# Patient Record
Sex: Female | Born: 1980 | Race: Black or African American | Hispanic: No | Smoking: Former smoker
Health system: Southern US, Community
[De-identification: ages and names within clinical notes are randomized; demographics above are authoritative.]

## PROBLEM LIST (undated history)

## (undated) DIAGNOSIS — O009 Unspecified ectopic pregnancy without intrauterine pregnancy: Secondary | ICD-10-CM

## (undated) DIAGNOSIS — F419 Anxiety disorder, unspecified: Secondary | ICD-10-CM

## (undated) DIAGNOSIS — R000 Tachycardia, unspecified: Secondary | ICD-10-CM

## (undated) DIAGNOSIS — E049 Nontoxic goiter, unspecified: Secondary | ICD-10-CM

## (undated) DIAGNOSIS — R079 Chest pain, unspecified: Secondary | ICD-10-CM

## (undated) DIAGNOSIS — L309 Dermatitis, unspecified: Secondary | ICD-10-CM

## (undated) DIAGNOSIS — K589 Irritable bowel syndrome without diarrhea: Secondary | ICD-10-CM

## (undated) DIAGNOSIS — K219 Gastro-esophageal reflux disease without esophagitis: Secondary | ICD-10-CM

## (undated) HISTORY — PX: EXTERNAL EAR SURGERY: SHX627

## (undated) HISTORY — PX: TONSILLECTOMY: SUR1361

---
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
## 1999-01-27 ENCOUNTER — Emergency Department (HOSPITAL_COMMUNITY): Admission: EM | Admit: 1999-01-27 | Discharge: 1999-01-28 | Payer: Self-pay | Admitting: *Deleted

## 1999-01-27 ENCOUNTER — Emergency Department (HOSPITAL_COMMUNITY): Admission: EM | Admit: 1999-01-27 | Discharge: 1999-01-27 | Payer: Self-pay | Admitting: *Deleted

## 1999-04-18 ENCOUNTER — Emergency Department (HOSPITAL_COMMUNITY): Admission: EM | Admit: 1999-04-18 | Discharge: 1999-04-18 | Payer: Self-pay | Admitting: Emergency Medicine

## 1999-06-26 ENCOUNTER — Emergency Department (HOSPITAL_COMMUNITY): Admission: EM | Admit: 1999-06-26 | Discharge: 1999-06-26 | Payer: Self-pay | Admitting: Emergency Medicine

## 1999-09-27 ENCOUNTER — Emergency Department (HOSPITAL_COMMUNITY): Admission: EM | Admit: 1999-09-27 | Discharge: 1999-09-27 | Payer: Self-pay

## 2000-05-03 ENCOUNTER — Emergency Department (HOSPITAL_COMMUNITY): Admission: EM | Admit: 2000-05-03 | Discharge: 2000-05-03 | Payer: Self-pay | Admitting: Emergency Medicine

## 2001-08-01 ENCOUNTER — Emergency Department (HOSPITAL_COMMUNITY): Admission: EM | Admit: 2001-08-01 | Discharge: 2001-08-01 | Payer: Self-pay | Admitting: Emergency Medicine

## 2002-07-08 ENCOUNTER — Emergency Department (HOSPITAL_COMMUNITY): Admission: EM | Admit: 2002-07-08 | Discharge: 2002-07-08 | Payer: Self-pay | Admitting: Emergency Medicine

## 2003-06-08 ENCOUNTER — Emergency Department (HOSPITAL_COMMUNITY): Admission: EM | Admit: 2003-06-08 | Discharge: 2003-06-08 | Payer: Self-pay | Admitting: Emergency Medicine

## 2003-12-28 ENCOUNTER — Emergency Department (HOSPITAL_COMMUNITY): Admission: EM | Admit: 2003-12-28 | Discharge: 2003-12-28 | Payer: Self-pay | Admitting: Emergency Medicine

## 2004-03-15 ENCOUNTER — Emergency Department (HOSPITAL_COMMUNITY): Admission: EM | Admit: 2004-03-15 | Discharge: 2004-03-15 | Payer: Self-pay | Admitting: Emergency Medicine

## 2004-07-30 ENCOUNTER — Emergency Department (HOSPITAL_COMMUNITY): Admission: EM | Admit: 2004-07-30 | Discharge: 2004-07-30 | Payer: Self-pay | Admitting: Emergency Medicine

## 2004-08-06 ENCOUNTER — Emergency Department (HOSPITAL_COMMUNITY): Admission: EM | Admit: 2004-08-06 | Discharge: 2004-08-06 | Payer: Self-pay | Admitting: Emergency Medicine

## 2004-08-19 ENCOUNTER — Emergency Department (HOSPITAL_COMMUNITY): Admission: EM | Admit: 2004-08-19 | Discharge: 2004-08-19 | Payer: Self-pay | Admitting: Family Medicine

## 2005-04-30 ENCOUNTER — Emergency Department (HOSPITAL_COMMUNITY): Admission: EM | Admit: 2005-04-30 | Discharge: 2005-04-30 | Payer: Self-pay | Admitting: Emergency Medicine

## 2005-04-30 ENCOUNTER — Emergency Department (HOSPITAL_COMMUNITY): Admission: EM | Admit: 2005-04-30 | Discharge: 2005-04-30 | Payer: Self-pay | Admitting: Family Medicine

## 2006-03-31 ENCOUNTER — Emergency Department (HOSPITAL_COMMUNITY): Admission: EM | Admit: 2006-03-31 | Discharge: 2006-03-31 | Payer: Self-pay | Admitting: Emergency Medicine

## 2006-06-05 ENCOUNTER — Emergency Department (HOSPITAL_COMMUNITY): Admission: EM | Admit: 2006-06-05 | Discharge: 2006-06-05 | Payer: Self-pay | Admitting: Emergency Medicine

## 2006-08-05 ENCOUNTER — Emergency Department (HOSPITAL_COMMUNITY): Admission: EM | Admit: 2006-08-05 | Discharge: 2006-08-05 | Payer: Self-pay | Admitting: Emergency Medicine

## 2006-11-18 ENCOUNTER — Emergency Department (HOSPITAL_COMMUNITY): Admission: EM | Admit: 2006-11-18 | Discharge: 2006-11-18 | Payer: Self-pay | Admitting: Family Medicine

## 2006-11-22 ENCOUNTER — Emergency Department (HOSPITAL_COMMUNITY): Admission: EM | Admit: 2006-11-22 | Discharge: 2006-11-22 | Payer: Self-pay | Admitting: Family Medicine

## 2007-01-01 ENCOUNTER — Emergency Department (HOSPITAL_COMMUNITY): Admission: EM | Admit: 2007-01-01 | Discharge: 2007-01-01 | Payer: Self-pay | Admitting: Family Medicine

## 2007-07-15 ENCOUNTER — Emergency Department (HOSPITAL_COMMUNITY): Admission: EM | Admit: 2007-07-15 | Discharge: 2007-07-15 | Payer: Self-pay | Admitting: Family Medicine

## 2007-08-09 ENCOUNTER — Emergency Department (HOSPITAL_COMMUNITY): Admission: EM | Admit: 2007-08-09 | Discharge: 2007-08-09 | Payer: Self-pay | Admitting: Emergency Medicine

## 2007-10-02 ENCOUNTER — Emergency Department (HOSPITAL_COMMUNITY): Admission: EM | Admit: 2007-10-02 | Discharge: 2007-10-02 | Payer: Self-pay | Admitting: Emergency Medicine

## 2008-01-05 ENCOUNTER — Emergency Department (HOSPITAL_COMMUNITY): Admission: EM | Admit: 2008-01-05 | Discharge: 2008-01-06 | Payer: Self-pay | Admitting: Emergency Medicine

## 2008-01-17 ENCOUNTER — Emergency Department (HOSPITAL_COMMUNITY): Admission: EM | Admit: 2008-01-17 | Discharge: 2008-01-17 | Payer: Self-pay | Admitting: Family Medicine

## 2008-03-21 ENCOUNTER — Emergency Department (HOSPITAL_COMMUNITY): Admission: EM | Admit: 2008-03-21 | Discharge: 2008-03-21 | Payer: Self-pay | Admitting: Family Medicine

## 2008-09-22 ENCOUNTER — Emergency Department (HOSPITAL_COMMUNITY): Admission: EM | Admit: 2008-09-22 | Discharge: 2008-09-22 | Payer: Self-pay | Admitting: Emergency Medicine

## 2008-11-15 ENCOUNTER — Emergency Department (HOSPITAL_COMMUNITY): Admission: EM | Admit: 2008-11-15 | Discharge: 2008-11-15 | Payer: Self-pay | Admitting: Emergency Medicine

## 2009-03-12 ENCOUNTER — Emergency Department (HOSPITAL_COMMUNITY): Admission: EM | Admit: 2009-03-12 | Discharge: 2009-03-12 | Payer: Self-pay | Admitting: Emergency Medicine

## 2009-05-10 ENCOUNTER — Ambulatory Visit: Payer: Self-pay | Admitting: Cardiovascular Disease

## 2009-05-10 ENCOUNTER — Ambulatory Visit: Payer: Self-pay | Admitting: Internal Medicine

## 2009-05-10 ENCOUNTER — Observation Stay (HOSPITAL_COMMUNITY): Admission: EM | Admit: 2009-05-10 | Discharge: 2009-05-11 | Payer: Self-pay | Admitting: Emergency Medicine

## 2009-05-11 ENCOUNTER — Encounter: Payer: Self-pay | Admitting: Internal Medicine

## 2009-05-28 ENCOUNTER — Ambulatory Visit (HOSPITAL_COMMUNITY): Admission: RE | Admit: 2009-05-28 | Discharge: 2009-05-28 | Payer: Self-pay | Admitting: Internal Medicine

## 2009-05-28 ENCOUNTER — Ambulatory Visit: Payer: Self-pay

## 2009-05-28 ENCOUNTER — Ambulatory Visit: Payer: Self-pay | Admitting: Cardiovascular Disease

## 2009-05-28 ENCOUNTER — Encounter: Payer: Self-pay | Admitting: Internal Medicine

## 2009-06-05 ENCOUNTER — Encounter: Payer: Self-pay | Admitting: Internal Medicine

## 2009-06-06 DIAGNOSIS — R079 Chest pain, unspecified: Secondary | ICD-10-CM | POA: Insufficient documentation

## 2009-06-07 ENCOUNTER — Ambulatory Visit: Payer: Self-pay | Admitting: Internal Medicine

## 2009-06-07 DIAGNOSIS — E049 Nontoxic goiter, unspecified: Secondary | ICD-10-CM | POA: Insufficient documentation

## 2009-06-12 ENCOUNTER — Ambulatory Visit (HOSPITAL_COMMUNITY): Admission: RE | Admit: 2009-06-12 | Discharge: 2009-06-12 | Payer: Self-pay | Admitting: Internal Medicine

## 2009-10-16 ENCOUNTER — Emergency Department (HOSPITAL_COMMUNITY): Admission: EM | Admit: 2009-10-16 | Discharge: 2009-10-16 | Payer: Self-pay | Admitting: Emergency Medicine

## 2010-02-07 ENCOUNTER — Emergency Department (HOSPITAL_COMMUNITY): Admission: EM | Admit: 2010-02-07 | Discharge: 2010-02-07 | Payer: Self-pay | Admitting: Emergency Medicine

## 2010-05-06 ENCOUNTER — Emergency Department (HOSPITAL_COMMUNITY)
Admission: EM | Admit: 2010-05-06 | Discharge: 2010-05-06 | Payer: Self-pay | Source: Home / Self Care | Admitting: Emergency Medicine

## 2010-06-02 LAB — CONVERTED CEMR LAB
T3, Free: 2.7 pg/mL (ref 2.3–4.2)
TSH: 0.4 microintl units/mL (ref 0.35–5.50)

## 2010-06-06 NOTE — Assessment & Plan Note (Signed)
Summary: eph/ gd   Primary Provider:  NONE  CC:  short lived CP and some SOB.  History of Present Illness: 30 y/o woman with no significant PMHx. Admitted with CP in January 2011 with anterior TWI. Had CT angio which was negative. CE normal. F/u stress echo was normal. Returns for f/u.  Overall doing fairly well . Feeling much better. Still with episodes of fleeting CP and SOB. Nothing prolonged. Able to do all her activities. No exertional symptoms. No HF symptoms. No palpitations.  Not exercising regularly.  Energy OK. Weight not going up. Continues to work as Associate Professor.   Current Medications (verified): 1)  Multivitamins   Tabs (Multiple Vitamin) .... Once Daily 2)  Ibuprofen 800 Mg Tabs (Ibuprofen) .... Every 8 Hrs As Needed  Allergies (verified): 1)  ! Amoxicillin 2)  ! Flagyl 3)  ! Pcn  Past History:  Past Medical History: 1. chest pain    --ECG with anterior TWI    --stress echo Jan 2011. Normal    --chest CT normal    Social History: Full Time Tobacco Use - Former. 2008 --- smoke 10 years Alcohol Use - yes - rarely Drug Use - no - marijuana as a teenager  Review of Systems       As per HPI and past medical history; otherwise all systems negative.   Vital Signs:  Patient profile:   30 year old female Height:      64 inches Weight:      184 pounds BMI:     31.70 Pulse rate:   69 / minute BP sitting:   104 / 70  (right arm) Cuff size:   regular  Vitals Entered By: Hardin Negus, RMA (June 07, 2009 3:54 PM)  Physical Exam  General:  Gen: well appearing. no resp difficulty HEENT: normal Neck: supple. no JVD. Carotids 2+ bilat; no bruits. Question  thryomegaly. Cor: PMI nondisplaced. Regular rate & rhythm. No rubs, gallops, murmur. Lungs: clear Abdomen: obese. soft, nontender, nondistended.  Extremities: no cyanosis, clubbing, rash, edema Neuro: alert & orientedx3, cranial nerves grossly intact. moves all 4 extremities w/o difficulty.  affect pleasant    Impression & Recommendations:  Problem # 1:  CHEST PAIN-UNSPECIFIED (ICD-786.50) Essentially resolved. Work-up very reassuring. No further cardiac testing needed at this time.  Problem # 2:  GOITER, UNSPECIFIED (ICD-240.9) Check thyroid panel and thyroid u/s.  Other Orders: EKG w/ Interpretation (93000) TLB-T4 (Thyrox), Free [phone]) TLB-TSH (Thyroid Stimulating Hormone) (84443-TSH) TLB-T3, Free (Triiodothyronine) (84481-T3FREE) Misc. Referral (Misc. Ref)  Patient Instructions: 1)  Labs today 2)  Thyroid Ultrasound

## 2010-06-06 NOTE — Letter (Signed)
Summary: Generic Letter  Architectural technologist, Main Office  [address]   [place], [place] [address]   Phone: [phone]  Fax: [phone]        June 05, 2009 MRN: [id number]    Zarriah Gater [address] [place], [place]  [address]    Dear Ms. Gladue,  We have been unable to reach you by phone regarding your test results.  Please give our office a call to receive your results.     Sincerely,  Meredith Staggers, RN Arvilla Meres, MD  This letter has been electronically signed by your physician.

## 2010-07-21 LAB — COMPREHENSIVE METABOLIC PANEL
ALT: 16 U/L (ref 0–35)
AST: 26 U/L (ref 0–37)
BUN: 10 mg/dL (ref 6–23)
CO2: 25 mEq/L (ref 19–32)
Chloride: 105 mEq/L (ref 96–112)
Creatinine, Ser: 0.83 mg/dL (ref 0.4–1.2)
GFR calc Af Amer: >60 mL/min (ref >60)
GFR calc non Af Amer: >60 mL/min (ref >60)
Glucose, Bld: 79 mg/dL (ref 70–99)
Sodium: 138 mEq/L (ref 135–145)
Total Bilirubin: 0.9 mg/dL (ref 0.3–1.2)

## 2010-07-21 LAB — POCT CARDIAC MARKERS
CKMB, poc: 1.1 ng/mL (ref 1.0–8.0)
Myoglobin, poc: 62.3 ng/mL (ref 12–200)
Myoglobin, poc: 65.8 ng/mL (ref 12–200)
Troponin i, poc: <0.05 ng/mL (ref 0.00–0.09)

## 2010-07-21 LAB — CBC
HCT: 34.7 % — ABNORMAL LOW (ref 36.0–46.0)
MCHC: 32.2 g/dL (ref 30.0–36.0)
MCHC: 32.8 g/dL (ref 30.0–36.0)
MCV: 73.3 fL — ABNORMAL LOW (ref 78.0–100.0)
Platelets: 207 10*3/uL (ref 150–400)
RBC: 4.3 MIL/uL (ref 3.87–5.11)
RBC: 4.73 MIL/uL (ref 3.87–5.11)
WBC: 6.6 10*3/uL (ref 4.0–10.5)

## 2010-07-21 LAB — LIPID PANEL
Cholesterol: 122 mg/dL (ref 0–200)
HDL: 35 mg/dL — ABNORMAL LOW (ref >39)
Total CHOL/HDL Ratio: 3.5 RATIO
Triglycerides: 36 mg/dL (ref <150)
VLDL: 7 mg/dL (ref 0–40)

## 2010-07-21 LAB — RAPID URINE DRUG SCREEN, HOSP PERFORMED
Barbiturates: NOT DETECTED
Benzodiazepines: NOT DETECTED
Cocaine: NOT DETECTED

## 2010-07-21 LAB — CARDIAC PANEL(CRET KIN+CKTOT+MB+TROPI)
CK, MB: 1.6 ng/mL (ref 0.3–4.0)
CK, MB: 2 ng/mL (ref 0.3–4.0)
Troponin I: 0.02 ng/mL (ref 0.00–0.06)
Troponin I: 0.02 ng/mL (ref 0.00–0.06)

## 2010-07-21 LAB — DIFFERENTIAL
Basophils Absolute: 0 10*3/uL (ref 0.0–0.1)
Eosinophils Absolute: 0.1 10*3/uL (ref 0.0–0.7)
Monocytes Relative: 4 % (ref 3–12)

## 2010-07-21 LAB — D-DIMER, QUANTITATIVE: D-Dimer, Quant: 0.54 ug/mL-FEU — ABNORMAL HIGH (ref 0.00–0.48)

## 2010-07-21 LAB — MAGNESIUM: Magnesium: 2 mg/dL (ref 1.5–2.5)

## 2010-07-21 LAB — HEPARIN LEVEL (UNFRACTIONATED): Heparin Unfractionated: 0.67 IU/mL (ref 0.30–0.70)

## 2010-10-31 ENCOUNTER — Emergency Department (HOSPITAL_COMMUNITY)
Admission: EM | Admit: 2010-10-31 | Discharge: 2010-10-31 | Disposition: A | Discharge status: Home / Self Care | Payer: Self-pay | Attending: Emergency Medicine | Admitting: Emergency Medicine

## 2010-10-31 DIAGNOSIS — K029 Dental caries, unspecified: Secondary | ICD-10-CM | POA: Insufficient documentation

## 2010-10-31 DIAGNOSIS — R070 Pain in throat: Secondary | ICD-10-CM | POA: Insufficient documentation

## 2010-10-31 DIAGNOSIS — H9209 Otalgia, unspecified ear: Secondary | ICD-10-CM | POA: Insufficient documentation

## 2011-02-04 ENCOUNTER — Emergency Department (HOSPITAL_COMMUNITY)
Admission: EM | Admit: 2011-02-04 | Discharge: 2011-02-04 | Disposition: A | Discharge status: Home / Self Care | Payer: BC Managed Care – PPO | Attending: Emergency Medicine | Admitting: Emergency Medicine

## 2011-02-04 ENCOUNTER — Emergency Department (HOSPITAL_COMMUNITY): Payer: BC Managed Care – PPO

## 2011-02-04 DIAGNOSIS — L2989 Other pruritus: Secondary | ICD-10-CM | POA: Insufficient documentation

## 2011-02-04 DIAGNOSIS — R21 Rash and other nonspecific skin eruption: Secondary | ICD-10-CM | POA: Insufficient documentation

## 2011-02-04 DIAGNOSIS — K219 Gastro-esophageal reflux disease without esophagitis: Secondary | ICD-10-CM | POA: Insufficient documentation

## 2011-02-04 DIAGNOSIS — L298 Other pruritus: Secondary | ICD-10-CM | POA: Insufficient documentation

## 2011-02-04 DIAGNOSIS — R1013 Epigastric pain: Secondary | ICD-10-CM | POA: Insufficient documentation

## 2011-02-04 DIAGNOSIS — R079 Chest pain, unspecified: Secondary | ICD-10-CM | POA: Insufficient documentation

## 2011-02-04 DIAGNOSIS — L259 Unspecified contact dermatitis, unspecified cause: Secondary | ICD-10-CM | POA: Insufficient documentation

## 2011-02-04 LAB — BASIC METABOLIC PANEL
BUN: 9 mg/dL (ref 6–23)
CO2: 27 mEq/L (ref 19–32)
Chloride: 102 mEq/L (ref 96–112)
Creatinine, Ser: 0.85 mg/dL (ref 0.50–1.10)

## 2011-02-04 LAB — CBC
HCT: 36.2 % (ref 36.0–46.0)
MCV: 71.8 fL — ABNORMAL LOW (ref 78.0–100.0)
RBC: 5.04 MIL/uL (ref 3.87–5.11)
WBC: 10.7 10*3/uL — ABNORMAL HIGH (ref 4.0–10.5)

## 2011-02-04 LAB — DIFFERENTIAL
Basophils Relative: 0 % (ref 0–1)
Eosinophils Relative: 1 % (ref 0–5)
Lymphs Abs: 2.6 10*3/uL (ref 0.7–4.0)
Monocytes Relative: 5 % (ref 3–12)
Neutro Abs: 7.5 10*3/uL (ref 1.7–7.7)

## 2011-02-04 LAB — POCT I-STAT TROPONIN I: Troponin i, poc: 0 ng/mL (ref 0.00–0.08)

## 2011-02-05 LAB — POCT I-STAT, CHEM 8
BUN: 13
Calcium, Ion: 1.22
Creatinine, Ser: 1.1
Glucose, Bld: 87
TCO2: 25

## 2011-02-05 LAB — POCT URINALYSIS DIP (DEVICE)
Bilirubin Urine: NEGATIVE
Ketones, ur: NEGATIVE
Operator id: 247071
Protein, ur: NEGATIVE

## 2011-02-05 LAB — POCT PREGNANCY, URINE: Preg Test, Ur: NEGATIVE

## 2011-02-17 LAB — POCT URINALYSIS DIP (DEVICE)
Bilirubin Urine: NEGATIVE
Glucose, UA: NEGATIVE
Hgb urine dipstick: NEGATIVE
Ketones, ur: NEGATIVE
Nitrite: NEGATIVE
Operator id: 235561
Specific Gravity, Urine: 1.025
Urobilinogen, UA: 0.2
pH: 7

## 2011-02-17 LAB — GC/CHLAMYDIA PROBE AMP, GENITAL: Chlamydia, DNA Probe: NEGATIVE

## 2011-02-17 LAB — POCT PREGNANCY, URINE: Preg Test, Ur: NEGATIVE

## 2011-02-17 LAB — WET PREP, GENITAL

## 2011-07-01 ENCOUNTER — Emergency Department (HOSPITAL_COMMUNITY)
Admission: EM | Admit: 2011-07-01 | Discharge: 2011-07-01 | Disposition: A | Discharge status: Home / Self Care | Payer: BC Managed Care – PPO | Attending: Emergency Medicine | Admitting: Emergency Medicine

## 2011-07-01 ENCOUNTER — Encounter (HOSPITAL_COMMUNITY): Payer: Self-pay

## 2011-07-01 DIAGNOSIS — H9209 Otalgia, unspecified ear: Secondary | ICD-10-CM | POA: Insufficient documentation

## 2011-07-01 DIAGNOSIS — H6120 Impacted cerumen, unspecified ear: Secondary | ICD-10-CM | POA: Insufficient documentation

## 2011-07-01 DIAGNOSIS — H9190 Unspecified hearing loss, unspecified ear: Secondary | ICD-10-CM | POA: Insufficient documentation

## 2011-07-01 DIAGNOSIS — Z79899 Other long term (current) drug therapy: Secondary | ICD-10-CM | POA: Insufficient documentation

## 2011-07-01 NOTE — ED Provider Notes (Signed)
History     CSN: [id number]  Arrival date & time 07/01/11  1206   First MD Initiated Contact with Patient 07/01/11 1236      HPI Patient reports this morning she was scratching her left ear and suddenly she could feel it "clogged up". States she tried some peroxide in the ear but had no relief. Denies any ear pain. Reports similar symptoms in the past but she was able to clear her ears on her own. Reports she does use Q-tips. Denies any purulent drainage, headache, fever, sore throat, lymphadenopathy. HPI  History reviewed. No pertinent past medical history.  Past Surgical History  Procedure Date  . External ear surgery     History reviewed. No pertinent family history.  History  Substance Use Topics  . Smoking status: Former Games developer  . Smokeless tobacco: Not on file  . Alcohol Use: Yes    OB History    Grav Para Term Preterm Abortions TAB SAB Ect Mult Living   0               Review of Systems  Constitutional: Negative for fever.  HENT: Positive for hearing loss. Negative for ear pain, congestion, rhinorrhea, neck pain and ear discharge.   Neurological: Negative for dizziness, light-headedness and headaches.    Allergies  Amoxicillin; Metronidazole; and Penicillins  Home Medications   Current Outpatient Rx  Name Route Sig Dispense Refill  . PANTOPRAZOLE SODIUM 40 MG PO TBEC Oral Take 40 mg by mouth daily as needed. For heart burn      BP 120/82  Pulse 75  Temp(Src) 98 F (36.7 C) (Oral)  Resp 20  SpO2 100%  LMP 06/27/2011  Physical Exam  Vitals reviewed. Constitutional: She is oriented to person, place, and time. Vital signs are normal. She appears well-developed and well-nourished. No distress.  HENT:  Head: Normocephalic and atraumatic.  Right Ear: Tympanic membrane, external ear and ear canal normal.  Left Ear: Tympanic membrane and external ear normal.  Ears:  Nose: Nose normal.  Mouth/Throat: Uvula is midline, oropharynx is clear and moist and  mucous membranes are normal.  Eyes: Pupils are equal, round, and reactive to light.  Neck: Neck supple.  Pulmonary/Chest: Effort normal.  Neurological: She is alert and oriented to person, place, and time.  Skin: Skin is warm and dry. No rash noted. No erythema. No pallor.  Psychiatric: She has a normal mood and affect. Her behavior is normal.    ED Course  Procedures  MDM  Irrigation completed by RN. Ear reassessed. clear with normal TMs. Patient reports she is ready for discharge. Discussed that she should not use Q-tips. Patient voices understanding .       Thomasene Lot, PA-C 07/01/11 1335

## 2011-07-01 NOTE — Discharge Instructions (Signed)
Cerumen Impaction A cerumen impaction is when the wax in your ear forms a plug. This plug usually causes reduced hearing. Sometimes it also causes an earache or dizziness. Removing a cerumen impaction can be difficult and painful. The wax sticks to the ear canal. The canal is sensitive and bleeds easily. If you try to remove a heavy wax buildup with a cotton tipped swab, you may push it in further. Irrigation with water, suction, and small ear curettes may be used to clear out the wax. If the impaction is fixed to the skin in the ear canal, ear drops may be needed for a few days to loosen the wax. People who build up a lot of wax frequently can use ear wax removal products available in your local drugstore. SEEK MEDICAL CARE IF:  You develop an earache, increased hearing loss, or marked dizziness. Document Released: 05/29/2004 Document Revised: 01/01/2011 Document Reviewed: 07/19/2009 Exit[employer] Patient Information 2012 [employer]. 

## 2011-07-01 NOTE — ED Notes (Signed)
Pt reports left ear clogged up, can barely hear, hx of same, peroxide did not work

## 2011-07-01 NOTE — ED Provider Notes (Signed)
Medical screening examination/treatment/procedure(s) were performed by non-physician practitioner and as supervising physician I was immediately available for consultation/collaboration. Devoria Albe, MD, Armando Gang   Ward Givens, MD 07/01/11 Windell Moment

## 2011-07-01 NOTE — ED Notes (Signed)
Left ear slowly irrigated with approx 140 mL warm water and peroxide (1:1 ratio).  Large amount of earwax removed.  Pt reports improvement in hearing and no complaints of pain.

## 2011-08-14 ENCOUNTER — Encounter (HOSPITAL_COMMUNITY): Payer: Self-pay | Admitting: *Deleted

## 2011-08-14 ENCOUNTER — Emergency Department (HOSPITAL_COMMUNITY)
Admission: EM | Admit: 2011-08-14 | Discharge: 2011-08-15 | Disposition: A | Discharge status: Home / Self Care | Payer: BC Managed Care – PPO | Attending: Emergency Medicine | Admitting: Emergency Medicine

## 2011-08-14 DIAGNOSIS — A499 Bacterial infection, unspecified: Secondary | ICD-10-CM | POA: Insufficient documentation

## 2011-08-14 DIAGNOSIS — N899 Noninflammatory disorder of vagina, unspecified: Secondary | ICD-10-CM | POA: Insufficient documentation

## 2011-08-14 DIAGNOSIS — N898 Other specified noninflammatory disorders of vagina: Secondary | ICD-10-CM

## 2011-08-14 DIAGNOSIS — B9689 Other specified bacterial agents as the cause of diseases classified elsewhere: Secondary | ICD-10-CM | POA: Insufficient documentation

## 2011-08-14 DIAGNOSIS — N760 Acute vaginitis: Secondary | ICD-10-CM | POA: Insufficient documentation

## 2011-08-14 LAB — URINALYSIS, ROUTINE W REFLEX MICROSCOPIC
Bilirubin Urine: NEGATIVE
Leukocytes, UA: NEGATIVE
Nitrite: NEGATIVE
Specific Gravity, Urine: >1.03 — ABNORMAL HIGH (ref 1.005–1.030)
pH: 5.5 (ref 5.0–8.0)

## 2011-08-14 LAB — POCT PREGNANCY, URINE: Preg Test, Ur: NEGATIVE

## 2011-08-14 LAB — WET PREP, GENITAL
Trich, Wet Prep: NONE SEEN
Yeast Wet Prep HPF POC: NONE SEEN

## 2011-08-14 MED ORDER — CLINDAMYCIN HCL 150 MG PO CAPS: 300.0000 mg | ORAL_CAPSULE | Freq: Two times a day (BID) | ORAL | Status: AC

## 2011-08-14 NOTE — ED Notes (Signed)
Patient states that she had onset of vaginal discharge and itching on Sunday. Pt denies any pain.

## 2011-08-14 NOTE — ED Provider Notes (Signed)
History     CSN: [id number]  Arrival date & time 08/14/11  2125   First MD Initiated Contact with Patient 08/14/11 2224      Chief Complaint  Patient presents with  . Vaginal Discharge    (Consider location/radiation/quality/duration/timing/severity/associated sxs/prior treatment) Patient is a 31 y.o. female presenting with vaginal discharge. The history is provided by the patient.  Vaginal Discharge This is a new problem. The current episode started in the past 7 days. The problem occurs constantly. The problem has been unchanged. Pertinent negatives include no abdominal pain, chest pain, chills, coughing, fever, headaches, nausea, neck pain, rash or vomiting. The symptoms are aggravated by nothing. She has tried nothing for the symptoms. The treatment provided no relief.    History reviewed. No pertinent past medical history.  Past Surgical History  Procedure Date  . External ear surgery     History reviewed. No pertinent family history.  History  Substance Use Topics  . Smoking status: Former Games developer  . Smokeless tobacco: Not on file  . Alcohol Use: Yes    OB History    Grav Para Term Preterm Abortions TAB SAB Ect Mult Living   0               Review of Systems  Constitutional: Negative for fever and chills.  HENT: Negative for neck pain.   Respiratory: Negative for cough, chest tightness and shortness of breath.   Cardiovascular: Negative for chest pain.  Gastrointestinal: Negative for nausea, vomiting, abdominal pain and diarrhea.  Genitourinary: Positive for vaginal discharge. Negative for dysuria, vaginal bleeding, vaginal pain and pelvic pain.  Skin: Negative for rash.  Neurological: Negative for headaches.  All other systems reviewed and are negative.    Allergies  Amoxicillin; Metronidazole; and Penicillins  Home Medications   Current Outpatient Rx  Name Route Sig Dispense Refill  . PANTOPRAZOLE SODIUM 40 MG PO TBEC Oral Take 40 mg by mouth daily  as needed. For heart burn      BP 130/55  Pulse 102  Temp(Src) 98.5 F (36.9 C) (Oral)  Resp 18  SpO2 100%  Physical Exam  Nursing note and vitals reviewed. Constitutional: She is oriented to person, place, and time. She appears well-developed and well-nourished.  HENT:  Head: Normocephalic and atraumatic.  Eyes: EOM are normal.  Neck: Normal range of motion.  Cardiovascular: Normal rate, regular rhythm and normal heart sounds.   Pulmonary/Chest: Effort normal and breath sounds normal. No respiratory distress.  Abdominal: Soft. There is no tenderness.  Genitourinary: Cervix exhibits no motion tenderness and no discharge. Right adnexum displays no mass and no tenderness. Left adnexum displays no mass and no tenderness. No vaginal discharge found.       Minor labial and vaginal irritation   Musculoskeletal: Normal range of motion.  Neurological: She is alert and oriented to person, place, and time.  Skin: Skin is warm and dry.  Psychiatric: She has a normal mood and affect.    ED Course  Procedures (including critical care time)  Labs Reviewed  URINALYSIS, ROUTINE W REFLEX MICROSCOPIC - Abnormal; Notable for the following:    Specific Gravity, Urine >1.030 (*)    All other components within normal limits  WET PREP, GENITAL - Abnormal; Notable for the following:    Clue Cells Wet Prep HPF POC FEW (*)    WBC, Wet Prep HPF POC FEW (*)    All other components within normal limits  POCT PREGNANCY, URINE  GC/CHLAMYDIA PROBE AMP,  GENITAL   No results found.   No diagnosis found.    MDM  Presents with vaginal discharge x 3 days.  Now associated with irritation, itching. No dysuria. States this began after intercourse with her boyfriend.  She does not see him frequently and reports multiple episodes of intercourse. No h/o STDs. LMP 4 weeks ago.    Pelvic benign with only minor labial and vaginal irritation. No CMT. No signs of PID or cervicitis.   Wet prep with few clues,  will treat as BV (with clinda given flagyl allergy).          Donnamarie Poag, MD 08/14/11 2356

## 2011-08-15 NOTE — ED Provider Notes (Signed)
I have personally seen and examined the patient.  I have discussed the plan of care with the resident.  I have reviewed the documentation on PMH/FH/Soc. History.  I have reviewed the documentation of the resident and agree.  Pt well appearing, no distress, stable for d/c  Joya Gaskins, MD 08/15/11 1546

## 2011-08-31 ENCOUNTER — Emergency Department (INDEPENDENT_AMBULATORY_CARE_PROVIDER_SITE_OTHER)
Admission: EM | Admit: 2011-08-31 | Discharge: 2011-08-31 | Disposition: A | Discharge status: Home / Self Care | Payer: BC Managed Care – PPO | Source: Home / Self Care | Attending: Family Medicine | Admitting: Family Medicine

## 2011-08-31 ENCOUNTER — Encounter (HOSPITAL_COMMUNITY): Payer: Self-pay

## 2011-08-31 DIAGNOSIS — B373 Candidiasis of vulva and vagina: Secondary | ICD-10-CM

## 2011-08-31 MED ORDER — FLUCONAZOLE 150 MG PO TABS: ORAL_TABLET | ORAL | Status: DC

## 2011-08-31 NOTE — Discharge Instructions (Signed)
Candidal Vulvovaginitis Candidal vulvovaginitis is an infection of the vagina and vulva. The vulva is the skin around the opening of the vagina. This may cause itching and discomfort in and around the vagina.  HOME CARE  Only take medicine as told by your doctor.   Do not have sex (intercourse) until the infection is healed or as told by your doctor.   Practice safe sex.   Tell your sex partner about your infection.   Do not douche or use tampons.   Wear cotton underwear. Do not wear tight pants or panty hose.   Eat yogurt. This may help treat and prevent yeast infections.  GET HELP RIGHT AWAY IF:   You have a fever.   Your problems get worse during treatment or do not get better in 3 days.   You have discomfort, irritation, or itching in your vagina or vulva area.   You have pain after sex.   You start to get belly (abdominal) pain.  MAKE SURE YOU:  Understand these instructions.   Will watch your condition.   Will get help right away if you are not doing well or get worse.  Document Released: 07/18/2008 Document Revised: 04/10/2011 Document Reviewed: 07/18/2008 Exit[employer] Patient Information 2012 [employer]. 

## 2011-08-31 NOTE — ED Notes (Signed)
Pt finished antibiotics three days ago and now has yeast infection.

## 2011-09-01 NOTE — ED Provider Notes (Signed)
History     CSN: [id number]  Arrival date & time 08/31/11  1478   First MD Initiated Contact with Patient 08/31/11 1854      Chief Complaint  Patient presents with  . Vaginal Discharge    (Consider location/radiation/quality/duration/timing/severity/associated sxs/prior treatment) HPI Comments: 31 y/o female former smoker recently treated for bacterial vaginosis with clyndamicin prescribed on 08/14/11. Here c/o vulvovaginal itchiness and change in her vaginal discharge to "cottage cheese like" states she usually has yeast vaginal infection after taking antibiotics. She tested negative for GC/CHL also on last visit. Same sexual partner. No pelvic pain or dysuria. LMP:08/17/11.   History reviewed. No pertinent past medical history.  Past Surgical History  Procedure Date  . External ear surgery     History reviewed. No pertinent family history.  History  Substance Use Topics  . Smoking status: Former Games developer  . Smokeless tobacco: Not on file  . Alcohol Use: Yes    OB History    Grav Para Term Preterm Abortions TAB SAB Ect Mult Living   0               Review of Systems  Constitutional: Negative for fever and chills.  Gastrointestinal: Negative for nausea, vomiting and abdominal pain.  Genitourinary: Positive for vaginal discharge. Negative for dysuria, frequency, hematuria, flank pain and pelvic pain.    Allergies  Amoxicillin; Metronidazole; and Penicillins  Home Medications   Current Outpatient Rx  Name Route Sig Dispense Refill  . FLUCONAZOLE 150 MG PO TABS  1 tab po now repeat in 72 hours. 2 tablet 0  . PANTOPRAZOLE SODIUM 40 MG PO TBEC Oral Take 40 mg by mouth daily as needed. For heart burn      BP 113/76  Pulse 71  Temp(Src) 98.2 F (36.8 C) (Oral)  Resp 16  SpO2 100%  LMP 08/17/2011  Physical Exam  Constitutional: She is oriented to person, place, and time. She appears well-developed and well-nourished. No distress.  HENT:  Mouth/Throat: No  oropharyngeal exudate.  Eyes: Conjunctivae are normal.  Cardiovascular: Normal heart sounds.   Pulmonary/Chest: Breath sounds normal.  Abdominal: Soft. There is no tenderness.       No CVT  Genitourinary:       Deferred pelvic exam. (Pt has recent pelvic with negative STD screening).  Lymphadenopathy:    She has no cervical adenopathy.  Neurological: She is alert and oriented to person, place, and time.    ED Course  Procedures (including critical care time)  Labs Reviewed - No data to display No results found.   1. Vulvovaginitis due to Candida       MDM  Treated empirically with diflucan.         Sharin Grave, MD 09/01/11 1049

## 2011-09-08 ENCOUNTER — Encounter (HOSPITAL_COMMUNITY): Payer: Self-pay

## 2011-09-08 ENCOUNTER — Inpatient Hospital Stay (HOSPITAL_COMMUNITY)
Admission: AD | Admit: 2011-09-08 | Discharge: 2011-09-08 | Disposition: A | Discharge status: Home / Self Care | Payer: BC Managed Care – PPO | Source: Ambulatory Visit | Attending: Obstetrics & Gynecology | Admitting: Obstetrics & Gynecology

## 2011-09-08 DIAGNOSIS — R109 Unspecified abdominal pain: Secondary | ICD-10-CM | POA: Insufficient documentation

## 2011-09-08 HISTORY — DX: Anxiety disorder, unspecified: F41.9

## 2011-09-08 LAB — URINALYSIS, ROUTINE W REFLEX MICROSCOPIC
Bilirubin Urine: NEGATIVE
Glucose, UA: NEGATIVE mg/dL
Hgb urine dipstick: NEGATIVE
Specific Gravity, Urine: 1.01 (ref 1.005–1.030)
pH: 6 (ref 5.0–8.0)

## 2011-09-08 LAB — POCT PREGNANCY, URINE: Preg Test, Ur: NEGATIVE

## 2011-09-08 NOTE — MAU Note (Signed)
Pt states dx'd w/bacterial infection, given flagyl, thought it made her get hives. Here with urinary frequency, has intermittent sharp shooting pain from vagina into her lower abdomen. Denies bleeding or vag d/c changes. Last intercourse 08/10/2011.

## 2011-09-08 NOTE — MAU Provider Note (Signed)
History   Jackie Faulkner is a 31 y.o. female who presents to MAU today complaining of sharp, lower abdominal pains x 3-4 days and increased frequency.  Pain occurs 1-2 times per day.  Patient has used ibuprofen for pain.  No dysuria, urgency, N/V/D, flank pain, fever, chills, changes to appetite.  Patient was recently treated for BV with clindamycin and yeast infection with diflucan.  LMP in mid-April.  No new sex partners and last had intercourse with partner in early April.  She states she has had constipation.  Large BM while here today has provided some relief of her symptoms.      CSN: [id number]  Arrival date & time 09/08/11  1517   None     Chief Complaint  Patient presents with  . Abdominal Pain    (Consider location/radiation/quality/duration/timing/severity/associated sxs/prior treatment) Abdominal Pain Associated symptoms include constipation and frequency. Pertinent negatives include no diarrhea, dysuria, hematuria, nausea or vomiting.    Past Medical History  Diagnosis Date  . Anxiety     Past Surgical History  Procedure Date  . External ear surgery     Family History  Problem Relation Age of Onset  . Anesthesia problems Neg Hx   . Hypotension Neg Hx   . Pseudochol deficiency Neg Hx   . Malignant hyperthermia Neg Hx     History  Substance Use Topics  . Smoking status: Former Games developer  . Smokeless tobacco: Not on file  . Alcohol Use: Yes    OB History    Grav Para Term Preterm Abortions TAB SAB Ect Mult Living   0               Review of Systems  Gastrointestinal: Positive for abdominal pain and constipation. Negative for nausea, vomiting, diarrhea and abdominal distention.  Genitourinary: Positive for frequency. Negative for dysuria, urgency, hematuria, flank pain, vaginal bleeding, vaginal discharge and difficulty urinating.  All other systems reviewed and are negative.    Allergies  Amoxicillin; Penicillins; and Metronidazole  Home  Medications  No current outpatient prescriptions on file.  BP 124/71  Pulse 108  Temp(Src) 97 F (36.1 C) (Oral)  Resp 16  Ht 5\' 4"  (1.626 m)  Wt 82.555 kg (182 lb)  BMI 31.24 kg/m2  LMP 08/17/2011  Physical Exam  Constitutional: She appears well-developed and well-nourished. No distress.  Cardiovascular: Normal rate, regular rhythm and normal heart sounds.   Pulses:      Radial pulses are 2+ on the right side, and 2+ on the left side.       Dorsalis pedis pulses are 2+ on the right side, and 2+ on the left side.  Pulmonary/Chest: Effort normal and breath sounds normal.  Abdominal: Soft. Bowel sounds are normal. There is no rebound, no guarding and no CVA tenderness.       Mild suprapubic tenderness to palpation.   Genitourinary: Vagina normal and uterus normal. Cervix exhibits no motion tenderness, no discharge and no friability.  Psychiatric: She has a normal mood and affect. Her speech is normal and behavior is normal. Judgment and thought content normal.    ED Course  Procedures (including critical care time)   Labs Reviewed  URINALYSIS, ROUTINE W REFLEX MICROSCOPIC  POCT PREGNANCY, URINE   Results for orders placed during the hospital encounter of 09/08/11 (from the past 24 hour(s))  URINALYSIS, ROUTINE W REFLEX MICROSCOPIC     Status: Normal   Collection Time   09/08/11  3:32 PM  Component Value Range   Color, Urine YELLOW  YELLOW    APPearance CLEAR  CLEAR    Specific Gravity, Urine 1.010  1.005 - 1.030    pH 6.0  5.0 - 8.0    Glucose, UA NEGATIVE  NEGATIVE (mg/dL)   Hgb urine dipstick NEGATIVE  NEGATIVE    Bilirubin Urine NEGATIVE  NEGATIVE    Ketones, ur NEGATIVE  NEGATIVE (mg/dL)   Protein, ur NEGATIVE  NEGATIVE (mg/dL)   Urobilinogen, UA 0.2  0.0 - 1.0 (mg/dL)   Nitrite NEGATIVE  NEGATIVE    Leukocytes, UA NEGATIVE  NEGATIVE   POCT PREGNANCY, URINE     Status: Normal   Collection Time   09/08/11  3:50 PM      Component Value Range   Preg Test, Ur  NEGATIVE  NEGATIVE      1. Abdominal pain    A: Abdominal pain   P: Advised patient that pain and increased frequency are likely related to her constipation.  U/A and pelvic exam revealed no abnormalities.  Can use ibuprofen for any continued pain.  Follow-up with PCP within the next few days if no improvement.    I saw and evaluated patient along with PA student and agree with above note.

## 2012-08-20 ENCOUNTER — Encounter (HOSPITAL_COMMUNITY): Payer: Self-pay | Admitting: *Deleted

## 2012-08-20 ENCOUNTER — Inpatient Hospital Stay (HOSPITAL_COMMUNITY)
Admission: AD | Admit: 2012-08-20 | Discharge: 2012-08-20 | Disposition: A | Discharge status: Home / Self Care | Payer: BC Managed Care – PPO | Source: Ambulatory Visit | Attending: Family Medicine | Admitting: Family Medicine

## 2012-08-20 DIAGNOSIS — K5900 Constipation, unspecified: Secondary | ICD-10-CM | POA: Insufficient documentation

## 2012-08-20 DIAGNOSIS — R350 Frequency of micturition: Secondary | ICD-10-CM | POA: Insufficient documentation

## 2012-08-20 DIAGNOSIS — N949 Unspecified condition associated with female genital organs and menstrual cycle: Secondary | ICD-10-CM | POA: Insufficient documentation

## 2012-08-20 DIAGNOSIS — R109 Unspecified abdominal pain: Secondary | ICD-10-CM | POA: Insufficient documentation

## 2012-08-20 LAB — POCT PREGNANCY, URINE: Preg Test, Ur: NEGATIVE

## 2012-08-20 LAB — WET PREP, GENITAL

## 2012-08-20 LAB — URINALYSIS, ROUTINE W REFLEX MICROSCOPIC
Hgb urine dipstick: NEGATIVE
Nitrite: NEGATIVE
Protein, ur: NEGATIVE mg/dL
Urobilinogen, UA: 0.2 mg/dL (ref 0.0–1.0)

## 2012-08-20 NOTE — MAU Provider Note (Signed)
History     CSN: [id number]  Arrival date and time: 08/20/12 1708   First Provider Initiated Contact with Patient 08/20/12 1834      Chief Complaint  Patient presents with  . Abdominal Pain  . Urinary Frequency  . Vaginal Discharge   HPI  Jackie Faulkner is 32 y.o. G0P0 presents with urinary frequency, abdominal pain and vaginal discharge X 4 days.  LMP 08/02/12 normal for her.   Not sexually active X 1 year.   States the discharge is clear and white and uncomfortable.  Abdominal pain is intermittent.   Has had GC/CHl cultures X 2 since last sexually active. Patient does report constipation earlier this week.  Sometimes stool is hard, others soft but difficult to "get out".  Patient of Dr. Glade Stanford appt for next week.    Past Medical History  Diagnosis Date  . Anxiety     Past Surgical History  Procedure Laterality Date  . External ear surgery    . No past surgeries      Family History  Problem Relation Age of Onset  . Anesthesia problems Neg Hx   . Hypotension Neg Hx   . Pseudochol deficiency Neg Hx   . Malignant hyperthermia Neg Hx   . Diabetes Mother   . Hypertension Mother   . Hypertension Father   . Heart disease Father     History  Substance Use Topics  . Smoking status: Former Games developer  . Smokeless tobacco: Not on file  . Alcohol Use: Yes    Allergies:  Allergies  Allergen Reactions  . Amoxicillin Other (See Comments)    Her mother is highly allergic so she chooses not to risk adverse side effects.  . Penicillins Other (See Comments)    Her mother is highly allergic so she chooses not to risk adverse side effects.  . Metronidazole Hives    Prescriptions prior to admission  Medication Sig Dispense Refill  . naproxen sodium (ANAPROX) 220 MG tablet Take 220 mg by mouth daily as needed (for headache).      . pantoprazole (PROTONIX) 40 MG tablet Take 40 mg by mouth daily as needed. For heart burn      . ranitidine (ZANTAC) 150 MG tablet Take 150 mg  by mouth daily as needed for heartburn.      . Vitamin D, Ergocalciferol, (DRISDOL) 50000 UNITS CAPS Take 50,000 Units by mouth 2 (two) times a week. On Tuesday and Thursday        Review of Systems  Constitutional: Negative for fever and chills.  Gastrointestinal: Positive for abdominal pain (lower).  Genitourinary: Positive for frequency. Negative for dysuria.       + for white vaginal discharge   Physical Exam   Blood pressure 122/70, pulse 76, temperature 98.3 F (36.8 C), temperature source Oral, resp. rate 16, height 5\' 3"  (1.6 m), weight 192 lb (87.091 kg), last menstrual period 08/02/2012, SpO2 100.00%.  Physical Exam  Constitutional: She is oriented to person, place, and time. She appears well-developed and well-nourished. No distress.  HENT:  Head: Normocephalic.  Neck: Normal range of motion.  Cardiovascular: Normal rate.   Respiratory: Effort normal.  GI: Soft. She exhibits no distension and no mass. There is tenderness. There is no rebound and no guarding.  Genitourinary: There is no rash, tenderness or lesion on the right labia. There is no rash, tenderness or lesion on the left labia. Uterus is not enlarged and not tender. Cervix exhibits no motion tenderness, no  discharge and no friability. Right adnexum displays no mass, no tenderness and no fullness. Left adnexum displays no mass, no tenderness and no fullness. No bleeding around the vagina. Vaginal discharge (small amount of white discharge without odor) found.  Neurological: She is alert and oriented to person, place, and time.  Skin: Skin is warm and dry.  Psychiatric: She has a normal mood and affect. Her behavior is normal.   Results for orders placed during the hospital encounter of 08/20/12 (from the past 24 hour(s))  URINALYSIS, ROUTINE W REFLEX MICROSCOPIC     Status: Abnormal   Collection Time    08/20/12  5:55 PM      Result Value Range   Color, Urine YELLOW  YELLOW   APPearance CLEAR  CLEAR   Specific  Gravity, Urine <1.005 (*) 1.005 - 1.030   pH 6.0  5.0 - 8.0   Glucose, UA NEGATIVE  NEGATIVE mg/dL   Hgb urine dipstick NEGATIVE  NEGATIVE   Bilirubin Urine NEGATIVE  NEGATIVE   Ketones, ur NEGATIVE  NEGATIVE mg/dL   Protein, ur NEGATIVE  NEGATIVE mg/dL   Urobilinogen, UA 0.2  0.0 - 1.0 mg/dL   Nitrite NEGATIVE  NEGATIVE   Leukocytes, UA NEGATIVE  NEGATIVE  POCT PREGNANCY, URINE     Status: None   Collection Time    08/20/12  6:18 PM      Result Value Range   Preg Test, Ur NEGATIVE  NEGATIVE  WET PREP, GENITAL     Status: Abnormal   Collection Time    08/20/12  6:44 PM      Result Value Range   Yeast Wet Prep HPF POC NONE SEEN  NONE SEEN   Trich, Wet Prep NONE SEEN  NONE SEEN   Clue Cells Wet Prep HPF POC NONE SEEN  NONE SEEN   WBC, Wet Prep HPF POC FEW (*) NONE SEEN    MAU Course  Procedures  MDM   Assessment and Plan  A;  Recent constipation     Neg UA and Wet prep  P: recommended adding fiber to her diet or metamucil      Increase po fluids      Keep appt with Dr. Gwenette Greet for next week.  Tena Linebaugh,EVE M 08/20/2012, 7:13 PM

## 2012-08-20 NOTE — MAU Note (Signed)
Patient states she has been having urinary frequency, abdominal pain and vaginal discharge for about 4 days.

## 2012-08-20 NOTE — MAU Provider Note (Signed)
Chart reviewed and agree with management and plan.  

## 2013-03-24 ENCOUNTER — Emergency Department (HOSPITAL_COMMUNITY)
Admission: EM | Admit: 2013-03-24 | Discharge: 2013-03-24 | Disposition: A | Discharge status: Home / Self Care | Payer: BC Managed Care – PPO | Attending: Emergency Medicine | Admitting: Emergency Medicine

## 2013-03-24 ENCOUNTER — Encounter (HOSPITAL_COMMUNITY): Payer: Self-pay | Admitting: Emergency Medicine

## 2013-03-24 DIAGNOSIS — Z87891 Personal history of nicotine dependence: Secondary | ICD-10-CM | POA: Insufficient documentation

## 2013-03-24 DIAGNOSIS — Z8659 Personal history of other mental and behavioral disorders: Secondary | ICD-10-CM | POA: Insufficient documentation

## 2013-03-24 DIAGNOSIS — K219 Gastro-esophageal reflux disease without esophagitis: Secondary | ICD-10-CM | POA: Insufficient documentation

## 2013-03-24 DIAGNOSIS — Z3202 Encounter for pregnancy test, result negative: Secondary | ICD-10-CM | POA: Insufficient documentation

## 2013-03-24 DIAGNOSIS — Z79899 Other long term (current) drug therapy: Secondary | ICD-10-CM | POA: Insufficient documentation

## 2013-03-24 DIAGNOSIS — Z880 Allergy status to penicillin: Secondary | ICD-10-CM | POA: Insufficient documentation

## 2013-03-24 DIAGNOSIS — R002 Palpitations: Secondary | ICD-10-CM | POA: Insufficient documentation

## 2013-03-24 DIAGNOSIS — E869 Volume depletion, unspecified: Secondary | ICD-10-CM | POA: Insufficient documentation

## 2013-03-24 DIAGNOSIS — R42 Dizziness and giddiness: Secondary | ICD-10-CM | POA: Insufficient documentation

## 2013-03-24 LAB — POCT I-STAT, CHEM 8
BUN: 7 mg/dL (ref 6–23)
Hemoglobin: 13.9 g/dL (ref 12.0–15.0)
Sodium: 141 mEq/L (ref 135–145)
TCO2: 24 mmol/L (ref 0–100)

## 2013-03-24 LAB — URINALYSIS, ROUTINE W REFLEX MICROSCOPIC
Bilirubin Urine: NEGATIVE
Glucose, UA: NEGATIVE mg/dL
Leukocytes, UA: NEGATIVE
Nitrite: NEGATIVE
Specific Gravity, Urine: 1.016 (ref 1.005–1.030)
pH: 6.5 (ref 5.0–8.0)

## 2013-03-24 LAB — GLUCOSE, CAPILLARY: Glucose-Capillary: 85 mg/dL (ref 70–99)

## 2013-03-24 MED ORDER — OMEPRAZOLE 20 MG PO CPDR: 20.0000 mg | DELAYED_RELEASE_CAPSULE | Freq: Every day | ORAL | Status: DC

## 2013-03-24 NOTE — ED Provider Notes (Signed)
CSN: [id number]     Arrival date & time 03/24/13  1417 History   First MD Initiated Contact with Patient 03/24/13 1502     Chief Complaint  Patient presents with  . Nausea  . Gastrophageal Reflux    The history is provided by the patient.   patient states over the past several months she's had a sensation that when she eats something it feels like it gets stuck down in her lower esophagus.  She reports occasional nausea.  She reports mild epigastric discomfort.  She reports no difficulty swallowing fluids at this time.  She does report mild decreased oral intake because of this he reports ongoing lightheadedness today when she stands up.  No syncope.  No chest pain or shortness of breath at this time.  Preceding palpitations.  No melena or hematochezia.  Symptoms are mild in severity.  She has not seen a GI doctor for followup.   Past Medical History  Diagnosis Date  . Anxiety    Past Surgical History  Procedure Laterality Date  . External ear surgery    . No past surgeries     Family History  Problem Relation Age of Onset  . Anesthesia problems Neg Hx   . Hypotension Neg Hx   . Pseudochol deficiency Neg Hx   . Malignant hyperthermia Neg Hx   . Diabetes Mother   . Hypertension Mother   . Hypertension Father   . Heart disease Father    History  Substance Use Topics  . Smoking status: Former Games developer  . Smokeless tobacco: Not on file  . Alcohol Use: Yes   OB History   Grav Para Term Preterm Abortions TAB SAB Ect Mult Living   0              Review of Systems  All other systems reviewed and are negative.    Allergies  Amoxicillin; Penicillins; and Metronidazole  Home Medications   Current Outpatient Rx  Name  Route  Sig  Dispense  Refill  . alum & mag hydroxide-simeth (MAALOX/MYLANTA) 200-200-20 MG/5ML suspension   Oral   Take 5 mLs by mouth daily as needed for indigestion or heartburn.         . ferrous sulfate 325 (65 FE) MG tablet   Oral   Take 325 mg by  mouth daily with breakfast.         . omeprazole (PRILOSEC) 20 MG capsule   Oral   Take 1 capsule (20 mg total) by mouth daily.   30 capsule   0    BP 113/71  Pulse 67  Temp(Src) 98.6 F (37 C) (Oral)  Resp 18  Wt 191 lb 6.4 oz (86.818 kg)  SpO2 100% Physical Exam  Nursing note and vitals reviewed. Constitutional: She is oriented to person, place, and time. She appears well-developed and well-nourished. No distress.  HENT:  Head: Normocephalic and atraumatic.  Eyes: EOM are normal.  Neck: Normal range of motion.  Cardiovascular: Normal rate, regular rhythm and normal heart sounds.   Pulmonary/Chest: Effort normal and breath sounds normal.  Abdominal: Soft. She exhibits no distension. There is no tenderness.  Musculoskeletal: Normal range of motion.  Neurological: She is alert and oriented to person, place, and time.  Skin: Skin is warm and dry.  Psychiatric: She has a normal mood and affect. Judgment normal.    ED Course  Procedures (including critical care time) Labs Review Labs Reviewed  POCT I-STAT, CHEM 8 - Abnormal; Notable for  the following:    Calcium, Ion 1.26 (*)    All other components within normal limits  URINALYSIS, ROUTINE W REFLEX MICROSCOPIC  PREGNANCY, URINE  GLUCOSE, CAPILLARY   Imaging Review No results found.  EKG Interpretation    Date/Time:  Thursday March 24 2013 14:53:47 EST Ventricular Rate:  81 PR Interval:  154 QRS Duration: 67 QT Interval:  352 QTC Calculation: 408 R Axis:   57 Text Interpretation:  Sinus rhythm Nonspecific T abnormalities, anterior leads No significant change since last tracing           I personally reviewed the imaging tests through PACS system I reviewed available ER/hospitalization records through the EMR   MDM   1. Volume depletion    5:56 PM Patient feels much better this time.  Some of this may represent volume depletion.  She does continue to have an abnormal sensation of food becoming  stuck.  She tolerate fluids here in the emergency apartment.  Outpatient GI followup.  Home on Prilosec.    Lyanne Co, MD 03/25/13 [phone]

## 2013-03-24 NOTE — ED Notes (Signed)
MD at bedside. 

## 2013-03-24 NOTE — ED Notes (Signed)
Pt reports same symptoms she reported in triage along with multiple other symptoms. Pt reports feeling light headed and heaviness in her chest- pt is more concerned about this then her GI symptoms.

## 2013-03-24 NOTE — ED Notes (Signed)
Per pt sts for the past few months she has been having a sensation after she eats like her food is stuck in her esophagus. sts some nausea sometimes. sts she just doesn't feel well. Has been taking OTC meds with some relief for a brief period and then it comes back.

## 2013-03-24 NOTE — ED Notes (Signed)
Pt reports feeling better after oral fluids.

## 2013-04-27 ENCOUNTER — Observation Stay (HOSPITAL_COMMUNITY)
Admission: EM | Admit: 2013-04-27 | Discharge: 2013-04-28 | Disposition: A | Discharge status: Home / Self Care | Payer: BC Managed Care – PPO | Attending: Internal Medicine | Admitting: Internal Medicine

## 2013-04-27 ENCOUNTER — Emergency Department (HOSPITAL_COMMUNITY): Payer: BC Managed Care – PPO

## 2013-04-27 ENCOUNTER — Encounter (HOSPITAL_COMMUNITY): Payer: Self-pay | Admitting: Emergency Medicine

## 2013-04-27 DIAGNOSIS — E876 Hypokalemia: Secondary | ICD-10-CM

## 2013-04-27 DIAGNOSIS — R109 Unspecified abdominal pain: Secondary | ICD-10-CM | POA: Insufficient documentation

## 2013-04-27 DIAGNOSIS — R5381 Other malaise: Secondary | ICD-10-CM | POA: Insufficient documentation

## 2013-04-27 DIAGNOSIS — R112 Nausea with vomiting, unspecified: Principal | ICD-10-CM | POA: Insufficient documentation

## 2013-04-27 DIAGNOSIS — R51 Headache: Secondary | ICD-10-CM | POA: Insufficient documentation

## 2013-04-27 DIAGNOSIS — F411 Generalized anxiety disorder: Secondary | ICD-10-CM | POA: Insufficient documentation

## 2013-04-27 DIAGNOSIS — D649 Anemia, unspecified: Secondary | ICD-10-CM | POA: Insufficient documentation

## 2013-04-27 DIAGNOSIS — R7989 Other specified abnormal findings of blood chemistry: Secondary | ICD-10-CM

## 2013-04-27 DIAGNOSIS — E049 Nontoxic goiter, unspecified: Secondary | ICD-10-CM

## 2013-04-27 DIAGNOSIS — E860 Dehydration: Secondary | ICD-10-CM

## 2013-04-27 DIAGNOSIS — R000 Tachycardia, unspecified: Secondary | ICD-10-CM

## 2013-04-27 DIAGNOSIS — I498 Other specified cardiac arrhythmias: Secondary | ICD-10-CM | POA: Insufficient documentation

## 2013-04-27 DIAGNOSIS — K219 Gastro-esophageal reflux disease without esophagitis: Secondary | ICD-10-CM

## 2013-04-27 LAB — URINALYSIS, ROUTINE W REFLEX MICROSCOPIC
Leukocytes, UA: NEGATIVE
Protein, ur: 30 mg/dL — AB
Urobilinogen, UA: 1 mg/dL (ref 0.0–1.0)

## 2013-04-27 LAB — CBC WITH DIFFERENTIAL/PLATELET
Basophils Absolute: 0 10*3/uL (ref 0.0–0.1)
Lymphocytes Relative: 4 % — ABNORMAL LOW (ref 12–46)
Monocytes Relative: 2 % — ABNORMAL LOW (ref 3–12)
Platelets: 267 10*3/uL (ref 150–400)
RDW: 13.9 % (ref 11.5–15.5)
WBC: 15.7 10*3/uL — ABNORMAL HIGH (ref 4.0–10.5)

## 2013-04-27 LAB — POCT PREGNANCY, URINE: Preg Test, Ur: NEGATIVE

## 2013-04-27 LAB — COMPREHENSIVE METABOLIC PANEL
Albumin: 4 g/dL (ref 3.5–5.2)
Alkaline Phosphatase: 41 U/L (ref 39–117)
BUN: 10 mg/dL (ref 6–23)
Potassium: 3.2 mEq/L — ABNORMAL LOW (ref 3.5–5.1)
Sodium: 137 mEq/L (ref 135–145)
Total Protein: 7.6 g/dL (ref 6.0–8.3)

## 2013-04-27 LAB — D-DIMER, QUANTITATIVE: D-Dimer, Quant: 0.46 ug/mL-FEU (ref 0.00–0.48)

## 2013-04-27 LAB — URINE MICROSCOPIC-ADD ON

## 2013-04-27 MED ORDER — SODIUM CHLORIDE 0.9 % IV BOLUS (SEPSIS)
1000.0000 mL | Freq: Once | INTRAVENOUS | Status: AC
  Administered 2013-04-27: 1000 mL via INTRAVENOUS

## 2013-04-27 MED ORDER — IOHEXOL 350 MG/ML SOLN
100.0000 mL | Freq: Once | INTRAVENOUS | Status: AC | PRN
  Administered 2013-04-27: 100 mL via INTRAVENOUS

## 2013-04-27 MED ORDER — POTASSIUM CHLORIDE CRYS ER 20 MEQ PO TBCR
40.0000 meq | EXTENDED_RELEASE_TABLET | Freq: Once | ORAL | Status: AC
  Administered 2013-04-27: 40 meq via ORAL
  Filled 2013-04-27: qty 2

## 2013-04-27 MED ORDER — IBUPROFEN 800 MG PO TABS
800.0000 mg | ORAL_TABLET | Freq: Once | ORAL | Status: AC
  Administered 2013-04-27: 800 mg via ORAL
  Filled 2013-04-27: qty 1

## 2013-04-27 NOTE — ED Provider Notes (Signed)
CSN: [id number]     Arrival date & time 04/27/13  1641 History   First MD Initiated Contact with Patient 04/27/13 1755     Chief Complaint  Patient presents with  . Emesis   (Consider location/radiation/quality/duration/timing/severity/associated sxs/prior Treatment) HPI Comments: Patient presents emergency department with chief complaints of generalized body aches and myalgias, as well as nausea. She states the symptoms began suddenly earlier today. She denies any vomiting, but does have nausea. She also states that she feels constipated. She states that she has had some pain in her abdomen on the left side. She denies any dysuria, hematuria, or vaginal discharge. She denies fevers, and states that she does feel cold. She also states that she feels lightheaded. She denies any other health problems.  The history is provided by the patient. No language interpreter was used.    Past Medical History  Diagnosis Date  . Anxiety    Past Surgical History  Procedure Laterality Date  . External ear surgery    . No past surgeries     Family History  Problem Relation Age of Onset  . Anesthesia problems Neg Hx   . Hypotension Neg Hx   . Pseudochol deficiency Neg Hx   . Malignant hyperthermia Neg Hx   . Diabetes Mother   . Hypertension Mother   . Hypertension Father   . Heart disease Father    History  Substance Use Topics  . Smoking status: Former Games developer  . Smokeless tobacco: Not on file  . Alcohol Use: Yes   OB History   Grav Para Term Preterm Abortions TAB SAB Ect Mult Living   0              Review of Systems  All other systems reviewed and are negative.    Allergies  Amoxicillin; Penicillins; and Metronidazole  Home Medications   Current Outpatient Rx  Name  Route  Sig  Dispense  Refill  . ferrous sulfate 325 (65 FE) MG tablet   Oral   Take 325 mg by mouth daily with breakfast.         . hydrocortisone cream 1 %   Topical   Apply 1 application topically daily  as needed for itching. Apply to arm         . ibuprofen (ADVIL,MOTRIN) 200 MG tablet   Oral   Take 800 mg by mouth every 6 (six) hours as needed for mild pain.         Marland Kitchen omeprazole (PRILOSEC) 20 MG capsule   Oral   Take 1 capsule (20 mg total) by mouth daily.   30 capsule   0    BP 99/66  Pulse 106  Temp(Src) 97.6 F (36.4 C) (Oral)  Resp 13  SpO2 100%  LMP 03/27/2013 Physical Exam  Nursing note and vitals reviewed. Constitutional: She is oriented to person, place, and time. She appears well-developed and well-nourished.  HENT:  Head: Normocephalic and atraumatic.  Eyes: Conjunctivae and EOM are normal. Pupils are equal, round, and reactive to light.  Neck: Normal range of motion. Neck supple.  Cardiovascular: Regular rhythm.  Exam reveals no gallop and no friction rub.   No murmur heard. Tachycardic  Pulmonary/Chest: Effort normal and breath sounds normal. No respiratory distress. She has no wheezes. She has no rales. She exhibits no tenderness.  Abdominal: Soft. Bowel sounds are normal. She exhibits no distension and no mass. There is no tenderness. There is no rebound and no guarding.  No focal  abdominal tenderness, no right upper quadrant tenderness or Murphy sign, no right lower quadrant tenderness or pain at McBurney's point, no left-sided abdominal tenderness  Musculoskeletal: Normal range of motion. She exhibits no edema and no tenderness.  Neurological: She is alert and oriented to person, place, and time.  Skin: Skin is warm and dry.  Psychiatric: She has a normal mood and affect. Her behavior is normal. Judgment and thought content normal.    ED Course  Procedures (including critical care time) Results for orders placed during the hospital encounter of 04/27/13  URINALYSIS, ROUTINE W REFLEX MICROSCOPIC      Result Value Range   Color, Urine YELLOW  YELLOW   APPearance HAZY (*) CLEAR   Specific Gravity, Urine 1.024  1.005 - 1.030   pH 8.5 (*) 5.0 - 8.0    Glucose, UA NEGATIVE  NEGATIVE mg/dL   Hgb urine dipstick NEGATIVE  NEGATIVE   Bilirubin Urine NEGATIVE  NEGATIVE   Ketones, ur 15 (*) NEGATIVE mg/dL   Protein, ur 30 (*) NEGATIVE mg/dL   Urobilinogen, UA 1.0  0.0 - 1.0 mg/dL   Nitrite NEGATIVE  NEGATIVE   Leukocytes, UA NEGATIVE  NEGATIVE  CBC WITH DIFFERENTIAL      Result Value Range   WBC 15.7 (*) 4.0 - 10.5 K/uL   RBC 5.10  3.87 - 5.11 MIL/uL   Hemoglobin 12.7  12.0 - 15.0 g/dL   HCT 40.9  81.1 - 91.4 %   MCV 73.1 (*) 78.0 - 100.0 fL   MCH 24.9 (*) 26.0 - 34.0 pg   MCHC 34.0  30.0 - 36.0 g/dL   RDW 78.2  95.6 - 21.3 %   Platelets 267  150 - 400 K/uL   Neutrophils Relative % 94 (*) 43 - 77 %   Lymphocytes Relative 4 (*) 12 - 46 %   Monocytes Relative 2 (*) 3 - 12 %   Eosinophils Relative 0  0 - 5 %   Basophils Relative 0  0 - 1 %   Neutro Abs 14.8 (*) 1.7 - 7.7 K/uL   Lymphs Abs 0.6 (*) 0.7 - 4.0 K/uL   Monocytes Absolute 0.3  0.1 - 1.0 K/uL   Eosinophils Absolute 0.0  0.0 - 0.7 K/uL   Basophils Absolute 0.0  0.0 - 0.1 K/uL   RBC Morphology ELLIPTOCYTES     Smear Review LARGE PLATELETS PRESENT    COMPREHENSIVE METABOLIC PANEL      Result Value Range   Sodium 137  135 - 145 mEq/L   Potassium 3.2 (*) 3.5 - 5.1 mEq/L   Chloride 103  96 - 112 mEq/L   CO2 19  19 - 32 mEq/L   Glucose, Bld 100 (*) 70 - 99 mg/dL   BUN 10  6 - 23 mg/dL   Creatinine, Ser 0.86  0.50 - 1.10 mg/dL   Calcium 9.2  8.4 - 57.8 mg/dL   Total Protein 7.6  6.0 - 8.3 g/dL   Albumin 4.0  3.5 - 5.2 g/dL   AST 19  0 - 37 U/L   ALT 12  0 - 35 U/L   Alkaline Phosphatase 41  39 - 117 U/L   Total Bilirubin 0.8  0.3 - 1.2 mg/dL   GFR calc non Af Amer >90  >90 mL/min   GFR calc Af Amer >90  >90 mL/min  URINE MICROSCOPIC-ADD ON      Result Value Range   Squamous Epithelial / LPF MANY (*) RARE  WBC, UA 0-2  <3 WBC/hpf   Bacteria, UA MANY (*) RARE  LIPASE, BLOOD      Result Value Range   Lipase 21  11 - 59 U/L  D-DIMER, QUANTITATIVE      Result Value  Range   D-Dimer, Quant 0.46  0.00 - 0.48 ug/mL-FEU  POCT PREGNANCY, URINE      Result Value Range   Preg Test, Ur NEGATIVE  NEGATIVE   Ct Angio Chest Pe W/cm &/or Wo Cm  04/27/2013   CLINICAL DATA:  Nausea. Vomiting. Left lower quadrant pain. Tachycardia. Cough. Pulmonary embolism.  EXAM: CT ANGIOGRAPHY CHEST WITH CONTRAST  TECHNIQUE: Multidetector CT imaging of the chest was performed using the standard protocol during bolus administration of intravenous contrast. Multiplanar CT image reconstructions including MIPs were obtained to evaluate the vascular anatomy.  CONTRAST:  OMNIPAQUE IOHEXOL 350 MG/ML SOLN  COMPARISON:  Chest radiograph 02/03/2013.  Chest CT 05/11/2009.  FINDINGS: Aorta and branch vessels are within normal limits. There is no axillary, mediastinal, or hilar adenopathy. Technically adequate study without pulmonary embolism. Respiratory motion artifact mildly degrades the study. Dependent atelectasis is present in the lungs. No airspace disease. Bones appear within normal limits.  Review of the MIP images confirms the above findings. Central airways appear normal.  IMPRESSION: Negative CTA chest. Negative for pulmonary embolism or acute aortic abnormality.   Electronically Signed   By: Andreas Newport M.D.   On: 04/27/2013 22:07     EKG Interpretation   None     ED ECG REPORT  I personally interpreted this EKG   Date: 04/28/2013   Rate:111  Rhythm: sinus tachycardia  QRS Axis: normal  Intervals: normal  ST/T Wave abnormalities: normal  Conduction Disutrbances:none  Narrative Interpretation:   Old EKG Reviewed: none available    MDM   1. Tachycardia     Patient was myalgias, nausea, and tachycardia. Will give fluids, and will check basic labs, and will reassess. At this time abdomen is benign. No evidence of localized infection source.  Patient remains tachycardic after fluids.  Will check CT angio per Dr. Darlyn Chamber recommendations.    CT is negative.   Patient remains tachycardic.  Will admit to medicine for obs.   Roxy Horseman, PA-C 04/28/13 0004

## 2013-04-27 NOTE — ED Notes (Addendum)
Pt c/o nausea and vomiting onset earlier today.  On arrival to ED pt hyperventilating with hands cramping.  Encouraged to slow breathing down.  Pt also c/o tingling all over with cramping in hands and legs.  Pt now c/o LLQ pain.

## 2013-04-27 NOTE — ED Notes (Signed)
Ambulated patient. Patient states she has a headache still but is not light headed or dizzy. O2 saturation is between 97 -100 %, heart rate stayed between 113 - 123 while ambulating.

## 2013-04-27 NOTE — ED Notes (Signed)
Called main lab, Lipase can be added to collected blood

## 2013-04-28 ENCOUNTER — Encounter (HOSPITAL_COMMUNITY): Payer: Self-pay | Admitting: Internal Medicine

## 2013-04-28 DIAGNOSIS — R000 Tachycardia, unspecified: Secondary | ICD-10-CM

## 2013-04-28 DIAGNOSIS — R112 Nausea with vomiting, unspecified: Secondary | ICD-10-CM | POA: Diagnosis present

## 2013-04-28 DIAGNOSIS — E860 Dehydration: Secondary | ICD-10-CM

## 2013-04-28 DIAGNOSIS — E876 Hypokalemia: Secondary | ICD-10-CM

## 2013-04-28 DIAGNOSIS — R6889 Other general symptoms and signs: Secondary | ICD-10-CM

## 2013-04-28 DIAGNOSIS — K219 Gastro-esophageal reflux disease without esophagitis: Secondary | ICD-10-CM

## 2013-04-28 LAB — CBC WITH DIFFERENTIAL/PLATELET
Basophils Relative: 0 % (ref 0–1)
Eosinophils Absolute: 0.1 10*3/uL (ref 0.0–0.7)
Eosinophils Relative: 1 % (ref 0–5)
HCT: 32.4 % — ABNORMAL LOW (ref 36.0–46.0)
Hemoglobin: 10.8 g/dL — ABNORMAL LOW (ref 12.0–15.0)
Lymphocytes Relative: 20 % (ref 12–46)
Lymphs Abs: 1.3 10*3/uL (ref 0.7–4.0)
MCH: 24.6 pg — ABNORMAL LOW (ref 26.0–34.0)
MCHC: 33.3 g/dL (ref 30.0–36.0)
Neutro Abs: 4.4 10*3/uL (ref 1.7–7.7)
Neutrophils Relative %: 71 % (ref 43–77)
RBC: 4.39 MIL/uL (ref 3.87–5.11)

## 2013-04-28 LAB — T3, FREE: T3, Free: 2.4 pg/mL (ref 2.3–4.2)

## 2013-04-28 LAB — RAPID URINE DRUG SCREEN, HOSP PERFORMED
Benzodiazepines: NOT DETECTED
Cocaine: NOT DETECTED
Opiates: NOT DETECTED
Tetrahydrocannabinol: NOT DETECTED

## 2013-04-28 LAB — COMPREHENSIVE METABOLIC PANEL
ALT: 10 U/L (ref 0–35)
AST: 27 U/L (ref 0–37)
Albumin: 2.9 g/dL — ABNORMAL LOW (ref 3.5–5.2)
Calcium: 7.9 mg/dL — ABNORMAL LOW (ref 8.4–10.5)
Creatinine, Ser: 0.71 mg/dL (ref 0.50–1.10)
GFR calc Af Amer: >90 mL/min (ref >90)
Sodium: 137 mEq/L (ref 135–145)
Total Protein: 6.2 g/dL (ref 6.0–8.3)

## 2013-04-28 LAB — T4, FREE: Free T4: 0.84 ng/dL (ref 0.80–1.80)

## 2013-04-28 LAB — TROPONIN I: Troponin I: <0.3 ng/mL (ref <0.30)

## 2013-04-28 LAB — TSH: TSH: 0.142 u[IU]/mL — ABNORMAL LOW (ref 0.350–4.500)

## 2013-04-28 MED ORDER — ONDANSETRON HCL 4 MG/2ML IJ SOLN: 4.0000 mg | Freq: Four times a day (QID) | INTRAMUSCULAR | Status: DC | PRN

## 2013-04-28 MED ORDER — FERROUS SULFATE 325 (65 FE) MG PO TABS
325.0000 mg | ORAL_TABLET | Freq: Every day | ORAL | Status: DC
  Administered 2013-04-28: 325 mg via ORAL
  Filled 2013-04-28 (×2): qty 1

## 2013-04-28 MED ORDER — ENOXAPARIN SODIUM 40 MG/0.4ML ~~LOC~~ SOLN
40.0000 mg | SUBCUTANEOUS | Status: DC
  Filled 2013-04-28: qty 0.4

## 2013-04-28 MED ORDER — OMEPRAZOLE 40 MG PO CPDR: 40.0000 mg | DELAYED_RELEASE_CAPSULE | Freq: Two times a day (BID) | ORAL | Status: DC

## 2013-04-28 MED ORDER — PANTOPRAZOLE SODIUM 40 MG PO TBEC
40.0000 mg | DELAYED_RELEASE_TABLET | Freq: Every day | ORAL | Status: DC
  Administered 2013-04-28: 40 mg via ORAL
  Filled 2013-04-28: qty 1

## 2013-04-28 MED ORDER — ACETAMINOPHEN 650 MG RE SUPP: 650.0000 mg | Freq: Four times a day (QID) | RECTAL | Status: DC | PRN

## 2013-04-28 MED ORDER — SODIUM CHLORIDE 0.9 % IJ SOLN
3.0000 mL | Freq: Two times a day (BID) | INTRAMUSCULAR | Status: DC
  Administered 2013-04-28: 3 mL via INTRAVENOUS

## 2013-04-28 MED ORDER — ACETAMINOPHEN 325 MG PO TABS: 650.0000 mg | ORAL_TABLET | Freq: Four times a day (QID) | ORAL | Status: DC | PRN

## 2013-04-28 MED ORDER — SODIUM CHLORIDE 0.9 % IV SOLN
INTRAVENOUS | Status: DC
  Administered 2013-04-28: 03:00:00 via INTRAVENOUS

## 2013-04-28 MED ORDER — ONDANSETRON HCL 4 MG PO TABS: 4.0000 mg | ORAL_TABLET | Freq: Four times a day (QID) | ORAL | Status: DC | PRN

## 2013-04-28 NOTE — Discharge Summary (Signed)
Physician Discharge Summary  Jackie Faulkner ZOX:[id number] DOB: Mar 09, 1981 DOA: 04/27/2013  PCP: Gwynneth Aliment, MD  Admit date: 04/27/2013 Discharge date: 04/28/2013  Time spent: >30 minutes  Recommendations for Outpatient Follow-up:  1. Follow Thyroid profile 2. Patient to follow with endocrinology for further evaluation and treatment of abnormal TSH and goiter  Discharge Diagnoses:  Nausea & vomiting Abd pain  Tachycardia GERD Abnormal TSH and goiter  Discharge Condition: stable and improved. Will discharge home. Outpatient follow up with endocrinology and PCP.  Diet recommendation: low acidic diet  Filed Weights   04/28/13 0036  Weight: 89.2 kg (196 lb 10.4 oz)    History of present illness:  32 y.o. female with history of anxiety and anemia presented to the ER because of nausea vomiting. Patient states that at around 3 PM she started developing nausea vomiting last evening. She had 2 episodes of vomiting. After which she felt weak. Denies any chest pain or shortness of breath fever chills. In the ER patient was found to be tachycardic. Heart rate is around 130s sinus tachycardia. CT angiography chest was done to rule out PE. CAT scan was negative for PE. Patient was given 2 L normal saline bolus despite which patient thought it was still elevated and has been admitted for further observation. On my exam patient's nausea vomiting is improved and abdominal exam is benign. Labs revealed leukocytosis otherwise LFTs are normal. Patient denies any diarrhea. Patient denies having any old food or any sick contacts or any recent travel.    Hospital Course:  1-nausea, vomiting, dehydration and abd pain: secondary to gastroenteritis most likely induce by GERD vs viral.  -no signs of infection -symptoms now resolved -no further nausea or vomiting and tolerating diet -will adjust PPI dose for better protection -HR and hydration status resolved with IVF's  2-GERD: continue PPI and  advoid NSAID's  3-abnormal TSH/Goiter: patient subclinical hyperthyroidism.  -normal Free T4 and Free T3 -TSH 0.142 -will recommend appointment with endocrinology as an outpatient for further evaluation and treatment -patient reports some symptoms that could be associated with thyroid disease (intermittent episodes of diarrhea,  Palpitations, some sweating at time and feeling she is always in a rush)  4-hypokalemia: repleted   Procedures:  See below for x-ray reports  Consultations:  None   Discharge Exam: Filed Vitals:   04/28/13 0330  BP: 110/63  Pulse: 75  Temp: 98.4 F (36.9 C)  Resp: 20    General: AAOX3, no CP, no SOB, N/V resolved. Goiter appreciated on exam, no nodules. Cardiovascular: S1 and S2, no rubs or gallops, RRR Respiratory: CTA bilaterally Abd: soft, NT, ND, positive BS  Discharge Instructions  Discharge Orders   Future Orders Complete By Expires   Discharge instructions  As directed    Comments:     Minimize the use of NSAID's (ibuprofen, motrin, goody powder, excedrin, etc...) due to reflux disease Keep yourself well hydrated Follow with PCP and endocrinologist as instructed Take medications as prescribed       Medication List    STOP taking these medications       ibuprofen 200 MG tablet  Commonly known as:  ADVIL,MOTRIN      TAKE these medications       ferrous sulfate 325 (65 FE) MG tablet  Take 325 mg by mouth daily with breakfast.     hydrocortisone cream 1 %  Apply 1 application topically daily as needed for itching. Apply to arm     omeprazole 40 MG  capsule  Commonly known as:  PRILOSEC  Take 1 capsule (40 mg total) by mouth 2 (two) times daily before a meal.       Allergies  Allergen Reactions  . Amoxicillin Hives  . Penicillins Hives    Her mother is highly allergic so she chooses not to risk adverse side effects.  . Metronidazole Hives       Follow-up Information   Follow up with Gwynneth Aliment, MD. Schedule  an appointment as soon as possible for a visit in 2 weeks.   Specialty:  Internal Medicine   Contact information:   [address] [place] [address] [phone]       Call Carlus Pavlov, MD. (office to set up appointment)    Specialty:  Internal Medicine   Contact information:   [address] [place] [address] [phone]       The results of significant diagnostics from this hospitalization (including imaging, microbiology, ancillary and laboratory) are listed below for reference.    Significant Diagnostic Studies: Ct Angio Chest Pe W/cm &/or Wo Cm  04/27/2013   CLINICAL DATA:  Nausea. Vomiting. Left lower quadrant pain. Tachycardia. Cough. Pulmonary embolism.  EXAM: CT ANGIOGRAPHY CHEST WITH CONTRAST  TECHNIQUE: Multidetector CT imaging of the chest was performed using the standard protocol during bolus administration of intravenous contrast. Multiplanar CT image reconstructions including MIPs were obtained to evaluate the vascular anatomy.  CONTRAST:  OMNIPAQUE IOHEXOL 350 MG/ML SOLN  COMPARISON:  Chest radiograph 02/03/2013.  Chest CT 05/11/2009.  FINDINGS: Aorta and branch vessels are within normal limits. There is no axillary, mediastinal, or hilar adenopathy. Technically adequate study without pulmonary embolism. Respiratory motion artifact mildly degrades the study. Dependent atelectasis is present in the lungs. No airspace disease. Bones appear within normal limits.  Review of the MIP images confirms the above findings. Central airways appear normal.  IMPRESSION: Negative CTA chest. Negative for pulmonary embolism or acute aortic abnormality.   Electronically Signed   By: Andreas Newport M.D.   On: 04/27/2013 22:07   Labs: Basic Metabolic Panel:  Recent Labs Lab 04/27/13 1717 04/28/13 1010  NA 137 137  K 3.2* 4.2  CL 103 108  CO2 19 19  GLUCOSE 100* 84  BUN 10 7  CREATININE 0.84 0.71  CALCIUM 9.2 7.9*   Liver  Function Tests:  Recent Labs Lab 04/27/13 1717 04/28/13 1010  AST 19 27  ALT 12 10  ALKPHOS 41 34*  BILITOT 0.8 0.3  PROT 7.6 6.2  ALBUMIN 4.0 2.9*    Recent Labs Lab 04/27/13 1717  LIPASE 21   CBC:  Recent Labs Lab 04/27/13 1717 04/28/13 1010  WBC 15.7* 6.3  NEUTROABS 14.8* 4.4  HGB 12.7 10.8*  HCT 37.3 32.4*  MCV 73.1* 73.8*  PLT 267 229    Signed:  Lajuana Patchell  [facility] 04/28/2013, 12:00 PM

## 2013-04-28 NOTE — ED Notes (Signed)
Transporting patient to new room assignment. 

## 2013-04-28 NOTE — Progress Notes (Signed)
Pt discharging home with family.  Understands the importance of follow up with PCP.  All instructions and prescriptions given and reviewed, all questions answered.  Pt states she is confident she can safely walk out.

## 2013-04-28 NOTE — H&P (Addendum)
[facility] History and Physical  Jackie Faulkner ZOX:[id number] DOB: 02-Apr-1981 DOA: 04/27/2013  Referring physician: ER physician. PCP: Jackie Aliment, MD   Chief Complaint: Nausea vomiting and weakness.  HPI: Jackie Faulkner is a 32 y.o. female with history of anxiety and anemia presented to the ER because of nausea vomiting. Patient states that at around 3 PM she started developing nausea vomiting last evening. She had 2 episodes of vomiting. After which she felt weak. Denies any chest pain or shortness of breath fever chills. In the ER patient was found to be tachycardic. Heart rate is around 130s sinus tachycardia. CT angiography chest was done to rule out PE. CAT scan was negative for PE. Patient was given 2 L normal saline bolus despite which patient thought it was still elevated and has been admitted for further observation. On my exam patient's nausea vomiting is improved and abdominal exam is benign. Labs revealed leukocytosis otherwise LFTs are normal. Patient denies any diarrhea. Patient denies having any old food or any sick contacts or any recent travel.   Review of Systems: As presented in the history of presenting illness, rest negative.  Past Medical History  Diagnosis Date  . Anxiety    Past Surgical History  Procedure Laterality Date  . External ear surgery    . No past surgeries     Social History:  reports that she has quit smoking. She does not have any smokeless tobacco history on file. She reports that she drinks alcohol. She reports that she does not use illicit drugs. Where does patient live home. Can patient participate in ADLs? Yes.  Allergies  Allergen Reactions  . Amoxicillin Hives  . Penicillins Hives    Her mother is highly allergic so she chooses not to risk adverse side effects.  . Metronidazole Hives    Family History:  Family History  Problem Relation Age of Onset  . Anesthesia problems Neg Hx   . Hypotension Neg Hx   . Pseudochol  deficiency Neg Hx   . Malignant hyperthermia Neg Hx   . Diabetes Mother   . Hypertension Mother   . Hypertension Father   . Heart disease Father       Prior to Admission medications   Medication Sig Start Date End Date Taking? Authorizing Provider  ferrous sulfate 325 (65 FE) MG tablet Take 325 mg by mouth daily with breakfast.   Yes Historical Provider, MD  hydrocortisone cream 1 % Apply 1 application topically daily as needed for itching. Apply to arm   Yes Historical Provider, MD  ibuprofen (ADVIL,MOTRIN) 200 MG tablet Take 800 mg by mouth every 6 (six) hours as needed for mild pain.   Yes Historical Provider, MD  omeprazole (PRILOSEC) 20 MG capsule Take 1 capsule (20 mg total) by mouth daily. 03/24/13  Yes Lyanne Co, MD    Physical Exam: Filed Vitals:   04/27/13 2048 04/27/13 2121 04/28/13 0000 04/28/13 0036  BP: 106/59 104/65  110/59  Pulse: 107 103 99 95  Temp:    98.7 F (37.1 C)  TempSrc:    Oral  Resp: 24  17 20   Height:    5\' 4"  (1.626 m)  Weight:    89.2 kg (196 lb 10.4 oz)  SpO2: 99% 99% 98% 100%     General:  Well-developed and nourished.  Eyes: Anicteric no pallor.  ENT: No discharge from the ears eyes nose mouth.  Neck: No mass felt.  Cardiovascular: S1-S2 heard.  Respiratory: No  rhonchi or crepitations.  Abdomen: Soft nontender bowel sounds present.  Skin: No rash.  Musculoskeletal: No edema.  Psychiatric: Appears normal.  Neurologic: Alert and oriented to time place and person. Moves all extremities.  Labs on Admission:  Basic Metabolic Panel:  Recent Labs Lab 04/27/13 1717  NA 137  K 3.2*  CL 103  CO2 19  GLUCOSE 100*  BUN 10  CREATININE 0.84  CALCIUM 9.2   Liver Function Tests:  Recent Labs Lab 04/27/13 1717  AST 19  ALT 12  ALKPHOS 41  BILITOT 0.8  PROT 7.6  ALBUMIN 4.0    Recent Labs Lab 04/27/13 1717  LIPASE 21   No results found for this basename: AMMONIA,  in the last 168 hours CBC:  Recent Labs Lab  04/27/13 1717  WBC 15.7*  NEUTROABS 14.8*  HGB 12.7  HCT 37.3  MCV 73.1*  PLT 267   Cardiac Enzymes: No results found for this basename: CKTOTAL, CKMB, CKMBINDEX, TROPONINI,  in the last 168 hours  BNP (last 3 results) No results found for this basename: PROBNP,  in the last 8760 hours CBG: No results found for this basename: GLUCAP,  in the last 168 hours  Radiological Exams on Admission: Ct Angio Chest Pe W/cm &/or Wo Cm  04/27/2013   CLINICAL DATA:  Nausea. Vomiting. Left lower quadrant pain. Tachycardia. Cough. Pulmonary embolism.  EXAM: CT ANGIOGRAPHY CHEST WITH CONTRAST  TECHNIQUE: Multidetector CT imaging of the chest was performed using the standard protocol during bolus administration of intravenous contrast. Multiplanar CT image reconstructions including MIPs were obtained to evaluate the vascular anatomy.  CONTRAST:  OMNIPAQUE IOHEXOL 350 MG/ML SOLN  COMPARISON:  Chest radiograph 02/03/2013.  Chest CT 05/11/2009.  FINDINGS: Aorta and branch vessels are within normal limits. There is no axillary, mediastinal, or hilar adenopathy. Technically adequate study without pulmonary embolism. Respiratory motion artifact mildly degrades the study. Dependent atelectasis is present in the lungs. No airspace disease. Bones appear within normal limits.  Review of the MIP images confirms the above findings. Central airways appear normal.  IMPRESSION: Negative CTA chest. Negative for pulmonary embolism or acute aortic abnormality.   Electronically Signed   By: Andreas Newport M.D.   On: 04/27/2013 22:07     Assessment/Plan Principal Problem:   Nausea & vomiting Active Problems:   Tachycardia   1. Nausea and vomiting -  Possibly from gastroenteritis versus anxiety. Presently asymptomatic. I have ordered diet to be advanced as tolerated. I have ordered repeat LFTs in a.m. along with metabolic panel. Continue with gentle hydration. 2. Sinus tachycardia - monitor shows sinus tachycardia  with heart rate around 110 to 120s. EKG shows sinus tachycardia with T wave inversions in anterior leads. Patient has had stress test and 2-D echo in 2011 which were unremarkable. Check thyroid function tests to rule out hyperthyroidism. 3 weeks ago patient did come with some chest discomfort and was started on PPI and otherwise to follow with gastroenterologist at that time. Presently has no chest pain. 3. History of anxiety. 4. History of anemia on iron pills.    Code Status: Full code.  Family Communication: Patient has been at the bedside.  Disposition Plan: Admit for observation.    Reeve Turnley N. [facility] Pager [phone].  If 7PM-7AM, please contact night-coverage [email or web address] Password [facility] 04/28/2013, 2:35 AM

## 2013-05-02 NOTE — ED Provider Notes (Signed)
Medical screening examination/treatment/procedure(s) were performed by non-physician practitioner and as supervising physician I was immediately available for consultation/collaboration.  EKG Interpretation    Date/Time:  Wednesday April 27 2013 17:48:05 EST Ventricular Rate:  111 PR Interval:  148 QRS Duration: 70 QT Interval:  328 QTC Calculation: 446 R Axis:   57 Text Interpretation:  Sinus tachycardia Nonspecific T abnormalities, anterior leads ED PHYSICIAN INTERPRETATION AVAILABLE IN [facility] Confirmed by TEST, RECORD ([id number]) on 04/29/2013 6:35:09 AM              Shelda Jakes, MD 05/02/13 [phone]

## 2013-05-18 ENCOUNTER — Encounter (HOSPITAL_COMMUNITY): Payer: Self-pay | Admitting: *Deleted

## 2013-05-18 ENCOUNTER — Inpatient Hospital Stay (HOSPITAL_COMMUNITY): Payer: BC Managed Care – PPO

## 2013-05-18 ENCOUNTER — Inpatient Hospital Stay (HOSPITAL_COMMUNITY)
Admission: AD | Admit: 2013-05-18 | Discharge: 2013-05-18 | Disposition: A | Discharge status: Home / Self Care | Payer: BC Managed Care – PPO | Source: Ambulatory Visit | Attending: Obstetrics and Gynecology | Admitting: Obstetrics and Gynecology

## 2013-05-18 DIAGNOSIS — O99891 Other specified diseases and conditions complicating pregnancy: Secondary | ICD-10-CM | POA: Insufficient documentation

## 2013-05-18 DIAGNOSIS — R109 Unspecified abdominal pain: Secondary | ICD-10-CM | POA: Insufficient documentation

## 2013-05-18 DIAGNOSIS — N949 Unspecified condition associated with female genital organs and menstrual cycle: Secondary | ICD-10-CM | POA: Insufficient documentation

## 2013-05-18 DIAGNOSIS — O9989 Other specified diseases and conditions complicating pregnancy, childbirth and the puerperium: Secondary | ICD-10-CM

## 2013-05-18 DIAGNOSIS — O26899 Other specified pregnancy related conditions, unspecified trimester: Secondary | ICD-10-CM

## 2013-05-18 HISTORY — DX: Chest pain, unspecified: R07.9

## 2013-05-18 HISTORY — DX: Tachycardia, unspecified: R00.0

## 2013-05-18 HISTORY — DX: Nontoxic goiter, unspecified: E04.9

## 2013-05-18 LAB — URINALYSIS, ROUTINE W REFLEX MICROSCOPIC
Bilirubin Urine: NEGATIVE
GLUCOSE, UA: NEGATIVE mg/dL
HGB URINE DIPSTICK: NEGATIVE
KETONES UR: 15 mg/dL — AB
Leukocytes, UA: NEGATIVE
Nitrite: NEGATIVE
PROTEIN: NEGATIVE mg/dL
Specific Gravity, Urine: 1.025 (ref 1.005–1.030)
Urobilinogen, UA: 1 mg/dL (ref 0.0–1.0)
pH: 6 (ref 5.0–8.0)

## 2013-05-18 LAB — WET PREP, GENITAL
CLUE CELLS WET PREP: NONE SEEN
Trich, Wet Prep: NONE SEEN
Yeast Wet Prep HPF POC: NONE SEEN

## 2013-05-18 LAB — CBC
HEMATOCRIT: 34.8 % — AB (ref 36.0–46.0)
Hemoglobin: 11.2 g/dL — ABNORMAL LOW (ref 12.0–15.0)
MCH: 23.4 pg — ABNORMAL LOW (ref 26.0–34.0)
MCHC: 32.2 g/dL (ref 30.0–36.0)
MCV: 72.7 fL — ABNORMAL LOW (ref 78.0–100.0)
Platelets: 269 10*3/uL (ref 150–400)
RBC: 4.79 MIL/uL (ref 3.87–5.11)
RDW: 14.1 % (ref 11.5–15.5)
WBC: 8.7 10*3/uL (ref 4.0–10.5)

## 2013-05-18 LAB — ABO/RH: ABO/RH(D): A POS

## 2013-05-18 LAB — HCG, QUANTITATIVE, PREGNANCY: hCG, Beta Chain, Quant, S: 1257 m[IU]/mL — ABNORMAL HIGH (ref <5)

## 2013-05-18 LAB — POCT PREGNANCY, URINE: Preg Test, Ur: POSITIVE — AB

## 2013-05-18 NOTE — Discharge Instructions (Signed)
Pregnancy, First Trimester °The first trimester is the first 3 months your baby is growing inside you. It is important to follow your doctor's instructions. °HOME CARE  °· Do not smoke. °· Do not drink alcohol. °· Only take medicine as told by your doctor. °· Exercise. °· Eat healthy foods. Eat regular, well-balanced meals. °· You can have sex (intercourse) if there are no other problems with the pregnancy. °· Things that help with morning sickness: °· Eat soda crackers before getting up in the morning. °· Eat 4 to 5 small meals rather than 3 large meals. °· Drink liquids between meals, not during meals. °· Go to all appointments as told. °· Take all vitamins or supplements as told by your doctor. °GET HELP RIGHT AWAY IF:  °· You develop a fever. °· You have a bad smelling fluid that is leaking from your vagina. °· There is bleeding from the vagina. °· You develop severe belly (abdominal) or back pain. °· You throw up (vomit) blood. It may look like coffee grounds. °· You lose more than 2 pounds in a week. °· You gain 5 pounds or more in a week. °· You gain more than 2 pounds in a week and you see puffiness (swelling) in your feet, ankles, or legs. °· You have severe dizziness or pass out (faint). °· You are around people who have German measles, chickenpox, or fifth disease. °· You have a headache, watery poop (diarrhea), pain with peeing (urinating), or cannot breath right. °Document Released: 10/08/2007 Document Revised: 07/14/2011 Document Reviewed: 10/08/2007 °ExitCare® Patient Information ©2014 [employer]. ° °

## 2013-05-18 NOTE — MAU Provider Note (Signed)
History     CSN: [id number]  Arrival date and time: 05/18/13 1304   First Provider Initiated Contact with Patient 05/18/13 1708      Chief Complaint  Patient presents with  . Possible Pregnancy   HPI Ms. Jackie Faulkner is a 33 y.o. G1P0 at 1029w2d who presents to MAU today with complaint of +HPT and cramping. The patient denies pain at this time. She denies vaginal bleeding, discharge, fever, UTI symptoms or N/V/D.   OB History   Grav Para Term Preterm Abortions TAB SAB Ect Mult Living   1         0      Past Medical History  Diagnosis Date  . Anxiety   . Tachycardia   . Goiter, unspecified   . Chest pain, unspecified     Past Surgical History  Procedure Laterality Date  . External ear surgery      Family History  Problem Relation Age of Onset  . Anesthesia problems Neg Hx   . Hypotension Neg Hx   . Pseudochol deficiency Neg Hx   . Malignant hyperthermia Neg Hx   . Diabetes Mother   . Hypertension Mother   . Hypertension Father   . Heart disease Father     History  Substance Use Topics  . Smoking status: Former Games developermoker  . Smokeless tobacco: Never Used  . Alcohol Use: Yes     Comment: occasional.    Allergies:  Allergies  Allergen Reactions  . Amoxicillin Hives  . Penicillins Hives    Her mother is highly allergic so she chooses not to risk adverse side effects.  . Metronidazole Hives    No prescriptions prior to admission    Review of Systems  Constitutional: Negative for fever and malaise/fatigue.  Gastrointestinal: Positive for abdominal pain. Negative for nausea, vomiting, diarrhea and constipation.  Genitourinary: Negative for dysuria, urgency and frequency.       Neg - vaginal bleeding, discharge   Physical Exam   Blood pressure 115/81, pulse 84, temperature 98.2 F (36.8 C), resp. rate 18, height 5\' 4"  (1.626 m), weight 191 lb (86.637 kg), last menstrual period 03/27/2013.  Physical Exam  Constitutional: She is oriented to person,  place, and time. She appears well-developed and well-nourished. No distress.  HENT:  Head: Normocephalic and atraumatic.  Cardiovascular: Normal rate.   Respiratory: Effort normal.  GI: Soft. She exhibits no distension and no mass. There is no tenderness. There is no rebound and no guarding.  Genitourinary: Uterus is not enlarged and not tender. Cervix exhibits no motion tenderness, no discharge and no friability. Right adnexum displays no mass and no tenderness. Left adnexum displays no mass and no tenderness. No bleeding around the vagina. Vaginal discharge (moderate amount of thick white discharge noted) found.  Neurological: She is alert and oriented to person, place, and time.  Skin: Skin is warm and dry. No erythema.  Psychiatric: She has a normal mood and affect.   Results for orders placed during the hospital encounter of 05/18/13 (from the past 24 hour(s))  URINALYSIS, ROUTINE W REFLEX MICROSCOPIC     Status: Abnormal   Collection Time    05/18/13  1:20 PM      Result Value Range   Color, Urine AMBER (*) YELLOW   APPearance HAZY (*) CLEAR   Specific Gravity, Urine 1.025  1.005 - 1.030   pH 6.0  5.0 - 8.0   Glucose, UA NEGATIVE  NEGATIVE mg/dL   Hgb urine dipstick  NEGATIVE  NEGATIVE   Bilirubin Urine NEGATIVE  NEGATIVE   Ketones, ur 15 (*) NEGATIVE mg/dL   Protein, ur NEGATIVE  NEGATIVE mg/dL   Urobilinogen, UA 1.0  0.0 - 1.0 mg/dL   Nitrite NEGATIVE  NEGATIVE   Leukocytes, UA NEGATIVE  NEGATIVE  POCT PREGNANCY, URINE     Status: Abnormal   Collection Time    05/18/13  1:35 PM      Result Value Range   Preg Test, Ur POSITIVE (*) NEGATIVE  CBC     Status: Abnormal   Collection Time    05/18/13  3:05 PM      Result Value Range   WBC 8.7  4.0 - 10.5 K/uL   RBC 4.79  3.87 - 5.11 MIL/uL   Hemoglobin 11.2 (*) 12.0 - 15.0 g/dL   HCT 16.1 (*) 09.6 - 04.5 %   MCV 72.7 (*) 78.0 - 100.0 fL   MCH 23.4 (*) 26.0 - 34.0 pg   MCHC 32.2  30.0 - 36.0 g/dL   RDW 40.9  81.1 - 91.4 %    Platelets 269  150 - 400 K/uL  ABO/RH     Status: None   Collection Time    05/18/13  3:05 PM      Result Value Range   ABO/RH(D) A POS    HCG, QUANTITATIVE, PREGNANCY     Status: Abnormal   Collection Time    05/18/13  3:05 PM      Result Value Range   hCG, Beta Chain, Quant, S 1257 (*) <5 mIU/mL  WET PREP, GENITAL     Status: Abnormal   Collection Time    05/18/13  5:12 PM      Result Value Range   Yeast Wet Prep HPF POC NONE SEEN  NONE SEEN   Trich, Wet Prep NONE SEEN  NONE SEEN   Clue Cells Wet Prep HPF POC NONE SEEN  NONE SEEN   WBC, Wet Prep HPF POC MODERATE (*) NONE SEEN    US Ob Comp Less 14 Wks  05/18/2013   CLINICAL DATA:  Pelvic pain.  Pregnant.  EXAM: TRANSVAGINAL OB ULTRASOUND; OBSTETRIC <14 WK ULTRASOUND  TECHNIQUE: Transvaginal ultrasound was performed for complete evaluation of the gestation as well as the maternal uterus, adnexal regions, and pelvic cul-de-sac.  COMPARISON:  None.  FINDINGS: Intrauterine gestational sac: Visualized/normal in shape.  Yolk sac:  None  Embryo:  None  Cardiac Activity: N/A  Heart Rate: N/A bpm  MSD: 6.4  mm   5 w   2  d  CRL:     mm    w  d                  [place] EDC: 01/16/2014  Maternal uterus/adnexae:  No subchorionic hemorrhage.  Normal right ovary.  Normal left ovary.  Corpus luteum cyst noted.  No free pelvic fluid.  IMPRESSION: Intrauterine gestational sac estimated at 5 weeks and 2 days gestation. No yolk sac or embryo is identified. Recommend correlation with serial quantitative beta HCG levels and follow-up ultrasound examination in 2 weeks if indicated.   Electronically Signed   By: Loralie Champagne M.D.   On: 05/18/2013 16:25   US Ob Transvaginal  05/18/2013   CLINICAL DATA:  Pelvic pain.  Pregnant.  EXAM: TRANSVAGINAL OB ULTRASOUND; OBSTETRIC <14 WK ULTRASOUND  TECHNIQUE: Transvaginal ultrasound was performed for complete evaluation of the gestation as well as the maternal uterus, adnexal regions, and pelvic cul-de-sac.  COMPARISON:   None.  FINDINGS: Intrauterine gestational sac: Visualized/normal in shape.  Yolk sac:  None  Embryo:  None  Cardiac Activity: N/A  Heart Rate: N/A bpm  MSD: 6.4  mm   5 w   2  d  CRL:     mm    w  d                  [place] EDC: 01/16/2014  Maternal uterus/adnexae:  No subchorionic hemorrhage.  Normal right ovary.  Normal left ovary.  Corpus luteum cyst noted.  No free pelvic fluid.  IMPRESSION: Intrauterine gestational sac estimated at 5 weeks and 2 days gestation. No yolk sac or embryo is identified. Recommend correlation with serial quantitative beta HCG levels and follow-up ultrasound examination in 2 weeks if indicated.   Electronically Signed   By: Loralie Champagne M.D.   On: 05/18/2013 16:25    MAU Course  Procedures None  MDM +UPT CBC, ABO/Rh, quant hCG, wet prep, GC/Chlamydia and [place] today  Assessment and Plan  A: IUGS at [redacted]w[redacted]d without YS or FP  P: Discharge home Patient has appointment to start prenatal care with [facility] tomorrow Patient encouraged to keep that appointment First trimester warning signs discussed GC/Chlamydia pending Patient may return to MAU as needed or if her condition were to change or worsen  Freddi Starr, PA-C  05/18/2013, 9:09 PM

## 2013-05-18 NOTE — MAU Note (Signed)
Pt presents with complaints of having a positive pregnancy test last night and is having some lower abdominal cramping she says feels like sharp pains

## 2013-05-19 LAB — GC/CHLAMYDIA PROBE AMP
CT Probe RNA: NEGATIVE
GC PROBE AMP APTIMA: NEGATIVE

## 2013-05-19 NOTE — MAU Provider Note (Signed)
Attestation of Attending Supervision of Advanced Practitioner (CNM/NP): Evaluation and management procedures were performed by the Advanced Practitioner under my supervision and collaboration.  I have reviewed the Advanced Practitioner's note and chart, and I agree with the management and plan.  Alixandra Alfieri 05/19/2013 1:47 PM

## 2013-05-22 ENCOUNTER — Inpatient Hospital Stay (HOSPITAL_COMMUNITY): Payer: BC Managed Care – PPO

## 2013-05-22 ENCOUNTER — Inpatient Hospital Stay (HOSPITAL_COMMUNITY)
Admission: AD | Admit: 2013-05-22 | Discharge: 2013-05-22 | Disposition: A | Discharge status: Home / Self Care | Payer: BC Managed Care – PPO | Source: Ambulatory Visit | Attending: Obstetrics & Gynecology | Admitting: Obstetrics & Gynecology

## 2013-05-22 ENCOUNTER — Encounter (HOSPITAL_COMMUNITY): Payer: Self-pay

## 2013-05-22 DIAGNOSIS — O039 Complete or unspecified spontaneous abortion without complication: Secondary | ICD-10-CM | POA: Insufficient documentation

## 2013-05-22 DIAGNOSIS — R109 Unspecified abdominal pain: Secondary | ICD-10-CM | POA: Insufficient documentation

## 2013-05-22 LAB — CBC
HCT: 34.2 % — ABNORMAL LOW (ref 36.0–46.0)
Hemoglobin: 11 g/dL — ABNORMAL LOW (ref 12.0–15.0)
MCH: 23.4 pg — ABNORMAL LOW (ref 26.0–34.0)
MCHC: 32.2 g/dL (ref 30.0–36.0)
MCV: 72.6 fL — AB (ref 78.0–100.0)
PLATELETS: 246 10*3/uL (ref 150–400)
RBC: 4.71 MIL/uL (ref 3.87–5.11)
RDW: 13.9 % (ref 11.5–15.5)
WBC: 9.7 10*3/uL (ref 4.0–10.5)

## 2013-05-22 LAB — HCG, QUANTITATIVE, PREGNANCY: HCG, BETA CHAIN, QUANT, S: 1153 m[IU]/mL — AB (ref <5)

## 2013-05-22 LAB — URINALYSIS, ROUTINE W REFLEX MICROSCOPIC
Bilirubin Urine: NEGATIVE
Glucose, UA: NEGATIVE mg/dL
Ketones, ur: 15 mg/dL — AB
LEUKOCYTES UA: NEGATIVE
NITRITE: NEGATIVE
PROTEIN: NEGATIVE mg/dL
Urobilinogen, UA: 0.2 mg/dL (ref 0.0–1.0)
pH: 5.5 (ref 5.0–8.0)

## 2013-05-22 LAB — URINE MICROSCOPIC-ADD ON

## 2013-05-22 MED ORDER — PROMETHAZINE HCL 25 MG PO TABS: 25.0000 mg | ORAL_TABLET | Freq: Four times a day (QID) | ORAL | Status: DC | PRN

## 2013-05-22 MED ORDER — OXYCODONE-ACETAMINOPHEN 5-325 MG PO TABS: 1.0000 | ORAL_TABLET | ORAL | Status: DC | PRN

## 2013-05-22 MED ORDER — HYDROMORPHONE HCL PF 1 MG/ML IJ SOLN
1.0000 mg | Freq: Once | INTRAMUSCULAR | Status: AC
  Administered 2013-05-22: 1 mg via INTRAMUSCULAR
  Filled 2013-05-22: qty 1

## 2013-05-22 NOTE — MAU Provider Note (Signed)
History     CSN: [id number]  Arrival date and time: 05/22/13 1333   First Provider Initiated Contact with Patient 05/22/13 1520      Chief Complaint  Patient presents with  . Vaginal Bleeding   HPI Ms. Jackie Faulkner is a 33 y.o. G1P0 at 2639w6d who presents to MAU today with complaint of vaginal bleeding and lower abdominal pain. The patient was seen in MAU on 05/18/13. US showed IUGS without YS or FP. Quant hCG was 1257. Patient states that spotting started yesterday and today she began having severe lower abdominal pain and bleeding similar to a period. She rates her pain at 8/10 now. She states that the pain comes and goes. She has not taken anything for pain. She denies fever or N/V. Patient was supposed to follow-up with [facility] on 05/19/13 but states that appointment was rescheduled.   OB History   Grav Para Term Preterm Abortions TAB SAB Ect Mult Living   1         0      Past Medical History  Diagnosis Date  . Anxiety   . Tachycardia   . Goiter, unspecified   . Chest pain, unspecified     Past Surgical History  Procedure Laterality Date  . External ear surgery    . Tonsillectomy      Family History  Problem Relation Age of Onset  . Anesthesia problems Neg Hx   . Hypotension Neg Hx   . Pseudochol deficiency Neg Hx   . Malignant hyperthermia Neg Hx   . Diabetes Mother   . Hypertension Mother   . Hypertension Father   . Heart disease Father     History  Substance Use Topics  . Smoking status: Former Games developermoker  . Smokeless tobacco: Never Used  . Alcohol Use: No     Comment: occasional.    Allergies:  Allergies  Allergen Reactions  . Amoxicillin Hives  . Penicillins Hives    Her mother is highly allergic so she chooses not to risk adverse side effects.  . Metronidazole Hives    Prescriptions prior to admission  Medication Sig Dispense Refill  . acetaminophen (TYLENOL) 500 MG tablet Take 1,000 mg by mouth every 6 (six) hours as needed for  headache.      . ferrous sulfate 325 (65 FE) MG tablet Take 325 mg by mouth daily with breakfast.      . hydrocortisone cream 1 % Apply 1 application topically daily as needed for itching. Apply to arm      . omeprazole (PRILOSEC) 40 MG capsule Take 1 capsule (40 mg total) by mouth 2 (two) times daily before a meal.  60 capsule  1    Review of Systems  Constitutional: Negative for fever and malaise/fatigue.  Gastrointestinal: Positive for abdominal pain. Negative for nausea and vomiting.  Genitourinary: Negative for dysuria, urgency and frequency.       + vaginal bleeding   Physical Exam   Blood pressure 133/76, pulse 84, temperature 98.2 F (36.8 C), last menstrual period 03/27/2013.  Physical Exam  Constitutional: She is oriented to person, place, and time. She appears well-developed and well-nourished. No distress.  HENT:  Head: Normocephalic and atraumatic.  Cardiovascular: Normal rate, regular rhythm and normal heart sounds.   Respiratory: Effort normal and breath sounds normal. No respiratory distress.  GI: Soft. Bowel sounds are normal. She exhibits no distension and no mass. There is no tenderness. There is no rebound and no  guarding.  Genitourinary: Uterus is enlarged (slightly). Uterus is not tender. Cervix exhibits no motion tenderness, no discharge and no friability. Right adnexum displays no mass and no tenderness. Left adnexum displays no mass and no tenderness. There is bleeding (small amount of blood in the vagina) around the vagina. No vaginal discharge found.  Neurological: She is alert and oriented to person, place, and time.  Skin: Skin is warm and dry. No erythema.  Psychiatric: She has a normal mood and affect.   Results for orders placed during the hospital encounter of 05/22/13 (from the past 24 hour(s))  URINALYSIS, ROUTINE W REFLEX MICROSCOPIC     Status: Abnormal   Collection Time    05/22/13  2:10 PM      Result Value Range   Color, Urine YELLOW  YELLOW    APPearance CLEAR  CLEAR   Specific Gravity, Urine >1.030 (*) 1.005 - 1.030   pH 5.5  5.0 - 8.0   Glucose, UA NEGATIVE  NEGATIVE mg/dL   Hgb urine dipstick LARGE (*) NEGATIVE   Bilirubin Urine NEGATIVE  NEGATIVE   Ketones, ur 15 (*) NEGATIVE mg/dL   Protein, ur NEGATIVE  NEGATIVE mg/dL   Urobilinogen, UA 0.2  0.0 - 1.0 mg/dL   Nitrite NEGATIVE  NEGATIVE   Leukocytes, UA NEGATIVE  NEGATIVE  URINE MICROSCOPIC-ADD ON     Status: Abnormal   Collection Time    05/22/13  2:10 PM      Result Value Range   Squamous Epithelial / LPF FEW (*) RARE   WBC, UA 3-6  <3 WBC/hpf   RBC / HPF TOO NUMEROUS TO COUNT  <3 RBC/hpf   Bacteria, UA RARE  RARE   Urine-Other MUCOUS PRESENT    CBC     Status: Abnormal   Collection Time    05/22/13  2:55 PM      Result Value Range   WBC 9.7  4.0 - 10.5 K/uL   RBC 4.71  3.87 - 5.11 MIL/uL   Hemoglobin 11.0 (*) 12.0 - 15.0 g/dL   HCT 96.2 (*) 95.2 - 84.1 %   MCV 72.6 (*) 78.0 - 100.0 fL   MCH 23.4 (*) 26.0 - 34.0 pg   MCHC 32.2  30.0 - 36.0 g/dL   RDW 32.4  40.1 - 02.7 %   Platelets 246  150 - 400 K/uL   US Ob Transvaginal  05/22/2013   CLINICAL DATA:  Pain and bleeding.  Decreasing 20 data beta-hCG E.  EXAM: TRANSVAGINAL OB ULTRASOUND  TECHNIQUE: Transvaginal ultrasound was performed for complete evaluation of the gestation as well as the maternal uterus, adnexal regions, and pelvic cul-de-sac.  COMPARISON:  Pelvic ultrasound 05/18/2013  FINDINGS: Intrauterine gestational sac: The gestational sac is now evident in the lower uterine segment. The shape has changed and is now somewhat ovoid.  Yolk sac:  Present  Embryo:  Not present  MSD: 7.0  mm   5 w   2  d  Maternal uterus/adnexae: The adnexa are not visualized due to pain with examination. No significant free fluid is present.  IMPRESSION: 1. Irregular shape of the gestational sac, now present in the lower uterine segment. In combination with the decreasing quantitative beta HCG and no significant interval  growth, the findings are highly concerning for an abortion in progress.   Electronically Signed   By: Gennette Pac M.D.   On: 05/22/2013 16:33    MAU Course  Procedures None  MDM Wet prep and cultures negative  on 05/18/13 Quant hCG, CBC and [place] ordered today  Assessment and Plan  A: SAB  P: Discharge home Rx for Percocet and Phenergan given to the patient Bleeding precautions discussed Patient advised to call [facility] on Monday to discuss follow-up Patient advised that if [facility] does not plan to see her for follow-up that she should call [facility] clinic for a follow-up appointment in 2 weeks Patient may return to MAU as needed or if her condition were to change or worsen  Freddi Starr, PA-C  05/22/2013, 3:34 PM

## 2013-05-22 NOTE — Discharge Instructions (Signed)
Incomplete Miscarriage °A miscarriage is the sudden loss of an unborn baby (fetus) before the 20th week of pregnancy. In an incomplete miscarriage, parts of the fetus or placenta (afterbirth) remain in the body.  °Having a miscarriage can be an emotional experience. Talk with your health care provider about any questions you may have about miscarrying, the grieving process, and your future pregnancy plans. °CAUSES  °· Problems with the fetal chromosomes that make it impossible for the baby to develop normally. Problems with the baby's genes or chromosomes are most often the result of errors that occur by chance as the embryo divides and grows. The problems are not inherited from the parents. °· Infection of the cervix or uterus. °· Hormone problems. °· Problems with the cervix, such as having an incompetent cervix. This is when the tissue in the cervix is not strong enough to hold the pregnancy. °· Problems with the uterus, such as an abnormally shaped uterus, uterine fibroids, or congenital abnormalities. °· Certain medical conditions. °· Smoking, drinking alcohol, or taking illegal drugs. °· Trauma. °SYMPTOMS  °· Vaginal bleeding or spotting, with or without cramps or pain. °· Pain or cramping in the abdomen or lower back. °· Passing fluid, tissue, or blood clots from the vagina. °DIAGNOSIS  °Your health care provider will perform a physical exam. You may also have an ultrasound to confirm the miscarriage. Blood or urine tests may also be ordered. °TREATMENT  °· Usually, a dilation and curettage (D&C) procedure is performed. During a D&C procedure, the cervix is widened (dilated) and any remaining fetal or placental tissue is gently removed from the uterus. °· Antibiotic medicines are prescribed if there is an infection. Other medicines may be given to reduce the size of the uterus (contract) if there is a lot of bleeding. °· If you have Rh negative blood and your baby was Rh positive, you will need an Rho(D)  immune globulin shot. This shot will protect any future baby from having Rh blood problems in future pregnancies. °· You may be confined to bed rest. This means you should stay in bed and only get up to use the bathroom. °HOME CARE INSTRUCTIONS  °· Rest as directed by your health care provider. °· Restrict activity as directed by your health care provider. You may be allowed to continue light activity if curettage was not done but you require further treatment. °· Keep track of the number of pads you use each day. Keep track of how soaked (saturated) they are. Record this information. °· Do not  use tampons. °· Do not douche or have sexual intercourse until approved by your health care provider. °· Keep all follow-up appointments for re-evaluation and continuing management. °· Only take over-the-counter or prescription medicines for pain, fever, or discomfort as directed by your health care provider. °· Take antibiotic medicine as directed by your health care provider. Make sure you finish it even if you start to feel better. °SEEK IMMEDIATE MEDICAL CARE IF:  °· You experience severe cramps in your stomach, back, or abdomen. °· You have an unexplained temperature (make sure to record these temperatures). °· You pass large clots or tissue (save these for your health care provider to inspect). °· Your bleeding increases. °· You become light-headed, weak, or have fainting episodes. °MAKE SURE YOU:  °· Understand these instructions. °· Will watch your condition. °· Will get help right away if you are not doing well or get worse. °Document Released: 04/21/2005 Document Revised: 02/09/2013 Document Reviewed: 11/18/2012 °  [employer]® Patient Information ©2014 [employer]. ° °

## 2013-05-22 NOTE — MAU Note (Signed)
Pt presents with complaints of vaginal spotting that was brown on Friday and the bleeding has gotten worse today and is bright red with blood clots.

## 2013-05-22 NOTE — MAU Note (Signed)
Pt states pain began Wednesday. Bleeding worsened today. Has changed pad 3 times today.

## 2013-06-07 ENCOUNTER — Inpatient Hospital Stay (HOSPITAL_COMMUNITY)
Admission: AD | Admit: 2013-06-07 | Discharge: 2013-06-07 | Disposition: A | Discharge status: Home / Self Care | Payer: BC Managed Care – PPO | Source: Ambulatory Visit | Attending: Obstetrics and Gynecology | Admitting: Obstetrics and Gynecology

## 2013-06-07 ENCOUNTER — Encounter (HOSPITAL_COMMUNITY): Payer: Self-pay

## 2013-06-07 DIAGNOSIS — K589 Irritable bowel syndrome without diarrhea: Secondary | ICD-10-CM | POA: Insufficient documentation

## 2013-06-07 DIAGNOSIS — N898 Other specified noninflammatory disorders of vagina: Secondary | ICD-10-CM

## 2013-06-07 DIAGNOSIS — Z87891 Personal history of nicotine dependence: Secondary | ICD-10-CM | POA: Insufficient documentation

## 2013-06-07 DIAGNOSIS — R1012 Left upper quadrant pain: Secondary | ICD-10-CM

## 2013-06-07 DIAGNOSIS — N949 Unspecified condition associated with female genital organs and menstrual cycle: Secondary | ICD-10-CM | POA: Insufficient documentation

## 2013-06-07 DIAGNOSIS — R109 Unspecified abdominal pain: Secondary | ICD-10-CM | POA: Insufficient documentation

## 2013-06-07 DIAGNOSIS — N899 Noninflammatory disorder of vagina, unspecified: Secondary | ICD-10-CM | POA: Insufficient documentation

## 2013-06-07 DIAGNOSIS — K5900 Constipation, unspecified: Secondary | ICD-10-CM | POA: Insufficient documentation

## 2013-06-07 LAB — URINALYSIS, ROUTINE W REFLEX MICROSCOPIC
Bilirubin Urine: NEGATIVE
GLUCOSE, UA: NEGATIVE mg/dL
HGB URINE DIPSTICK: NEGATIVE
KETONES UR: NEGATIVE mg/dL
Leukocytes, UA: NEGATIVE
Nitrite: NEGATIVE
PH: 6 (ref 5.0–8.0)
Protein, ur: NEGATIVE mg/dL
Specific Gravity, Urine: >1.03 — ABNORMAL HIGH (ref 1.005–1.030)
Urobilinogen, UA: 1 mg/dL (ref 0.0–1.0)

## 2013-06-07 LAB — WET PREP, GENITAL
Trich, Wet Prep: NONE SEEN
Yeast Wet Prep HPF POC: NONE SEEN

## 2013-06-07 NOTE — Discharge Instructions (Signed)

## 2013-06-07 NOTE — MAU Provider Note (Signed)
History     CSN: [id number]  Arrival date and time: 06/07/13 1902   None     Chief Complaint  Patient presents with  . Pelvic Pain   HPI This is a 33 y.o. female who presents with vaginal irritation and left mid abdominal pain that comes and goes.  No fever, N/V/D, has had some constipation Had SAB two weeks ago, quants went to 0.  Had sex once last week. Wants to get pregnant, but told to wait 2 cycles Has been told she has IBS  RN Note:  Had miscarriage on 05/22/2013 and had blood work 7 days later at dr's office and per pt, HCG levels were neg. Since yesterday, having left side pelvic pain and white d/c with itching and was prescribed Diflucan but not helping.       OB History   Grav Para Term Preterm Abortions TAB SAB Ect Mult Living   1    1  1    0      Past Medical History  Diagnosis Date  . Anxiety   . Tachycardia   . Goiter, unspecified   . Chest pain, unspecified     Past Surgical History  Procedure Laterality Date  . External ear surgery    . Tonsillectomy      Family History  Problem Relation Age of Onset  . Anesthesia problems Neg Hx   . Hypotension Neg Hx   . Pseudochol deficiency Neg Hx   . Malignant hyperthermia Neg Hx   . Diabetes Mother   . Hypertension Mother   . Hypertension Father   . Heart disease Father     History  Substance Use Topics  . Smoking status: Former Smoker    Quit date: 05/05/2005  . Smokeless tobacco: Never Used  . Alcohol Use: No     Comment: occasional.    Allergies:  Allergies  Allergen Reactions  . Amoxicillin Hives  . Penicillins Hives    Her mother is highly allergic so she chooses not to risk adverse side effects.  . Metronidazole Hives    Prescriptions prior to admission  Medication Sig Dispense Refill  . acetaminophen (TYLENOL) 500 MG tablet Take 1,000 mg by mouth every 6 (six) hours as needed for headache.      Marland Kitchen omeprazole (PRILOSEC) 40 MG capsule Take 40 mg by mouth 2 (two) times daily as  needed (for acid reflux.).      Marland Kitchen oxyCODONE-acetaminophen (PERCOCET/ROXICET) 5-325 MG per tablet Take 1-2 tablets by mouth every 4 (four) hours as needed for severe pain.  20 tablet  0  . promethazine (PHENERGAN) 25 MG tablet Take 1 tablet (25 mg total) by mouth every 6 (six) hours as needed for nausea or vomiting.  30 tablet  0  . [DISCONTINUED] omeprazole (PRILOSEC) 40 MG capsule Take 1 capsule (40 mg total) by mouth 2 (two) times daily before a meal.  60 capsule  1    Review of Systems  Constitutional: Negative for fever and chills.  Gastrointestinal: Positive for abdominal pain (Mid-left abdomen pain comes and goes) and constipation. Negative for nausea, vomiting and diarrhea.  Genitourinary:       Vaginal irritation   Neurological: Negative for headaches.   Physical Exam   Blood pressure 116/85, pulse 72, temperature 98.4 F (36.9 C), temperature source Oral, resp. rate 18, last menstrual period 03/27/2013, not currently breastfeeding.  Physical Exam  Constitutional: She is oriented to person, place, and time. She appears well-developed and well-nourished. No distress.  HENT:  Head: Normocephalic.  Cardiovascular: Normal rate.   Respiratory: Effort normal.  GI: Soft. She exhibits no distension and no mass. There is tenderness (slight tenderness mid left abdomen). There is no rebound and no guarding.  Genitourinary: Vagina normal and uterus normal. No vaginal discharge found.  No erethema or lesions No CMT Uterus nontender, small  Musculoskeletal: Normal range of motion.  Neurological: She is alert and oriented to person, place, and time.  Skin: Skin is warm and dry.  Psychiatric: She has a normal mood and affect.    MAU Course  Procedures Results for orders placed during the hospital encounter of 06/07/13 (from the past 24 hour(s))  URINALYSIS, ROUTINE W REFLEX MICROSCOPIC     Status: Abnormal   Collection Time    06/07/13  7:15 PM      Result Value Range   Color,  Urine YELLOW  YELLOW   APPearance CLEAR  CLEAR   Specific Gravity, Urine >1.030 (*) 1.005 - 1.030   pH 6.0  5.0 - 8.0   Glucose, UA NEGATIVE  NEGATIVE mg/dL   Hgb urine dipstick NEGATIVE  NEGATIVE   Bilirubin Urine NEGATIVE  NEGATIVE   Ketones, ur NEGATIVE  NEGATIVE mg/dL   Protein, ur NEGATIVE  NEGATIVE mg/dL   Urobilinogen, UA 1.0  0.0 - 1.0 mg/dL   Nitrite NEGATIVE  NEGATIVE   Leukocytes, UA NEGATIVE  NEGATIVE  WET PREP, GENITAL     Status: Abnormal   Collection Time    06/07/13  8:00 PM      Result Value Range   Yeast Wet Prep HPF POC NONE SEEN  NONE SEEN   Trich, Wet Prep NONE SEEN  NONE SEEN   Clue Cells Wet Prep HPF POC FEW (*) NONE SEEN   WBC, Wet Prep HPF POC FEW (*) NONE SEEN     Assessment and Plan  A;  Vaginal irritation, possibly from pads       Abdominal pain, probably IBS  P:  Discussed with Dr Henderson Cloudomblin       Discharge home       Recommend fiber therapy nightly with water       Change from Always to another brand of pad       Avoid perfumes, sprays, etc  Desiraye Rolfson 06/07/2013, 8:00 PM

## 2013-06-07 NOTE — MAU Note (Signed)
Had miscarriage on 05/22/2013 and had blood work 7 days later at dr's office and per pt, HCG levels were neg.  Since yesterday, having left side pelvic pain and white d/c with itching and was prescribed Diflucan but not helping.

## 2013-06-24 ENCOUNTER — Emergency Department (HOSPITAL_COMMUNITY): Payer: BC Managed Care – PPO

## 2013-06-24 ENCOUNTER — Encounter (HOSPITAL_COMMUNITY): Payer: Self-pay | Admitting: Emergency Medicine

## 2013-06-24 ENCOUNTER — Emergency Department (HOSPITAL_COMMUNITY)
Admission: EM | Admit: 2013-06-24 | Discharge: 2013-06-24 | Disposition: A | Discharge status: Home / Self Care | Payer: BC Managed Care – PPO | Attending: Emergency Medicine | Admitting: Emergency Medicine

## 2013-06-24 DIAGNOSIS — R439 Unspecified disturbances of smell and taste: Secondary | ICD-10-CM | POA: Insufficient documentation

## 2013-06-24 DIAGNOSIS — R438 Other disturbances of smell and taste: Secondary | ICD-10-CM

## 2013-06-24 DIAGNOSIS — Z880 Allergy status to penicillin: Secondary | ICD-10-CM | POA: Insufficient documentation

## 2013-06-24 DIAGNOSIS — Z8639 Personal history of other endocrine, nutritional and metabolic disease: Secondary | ICD-10-CM | POA: Insufficient documentation

## 2013-06-24 DIAGNOSIS — Z8659 Personal history of other mental and behavioral disorders: Secondary | ICD-10-CM | POA: Insufficient documentation

## 2013-06-24 DIAGNOSIS — R21 Rash and other nonspecific skin eruption: Secondary | ICD-10-CM | POA: Insufficient documentation

## 2013-06-24 DIAGNOSIS — Z862 Personal history of diseases of the blood and blood-forming organs and certain disorders involving the immune mechanism: Secondary | ICD-10-CM | POA: Insufficient documentation

## 2013-06-24 DIAGNOSIS — R432 Parageusia: Secondary | ICD-10-CM

## 2013-06-24 DIAGNOSIS — Z87891 Personal history of nicotine dependence: Secondary | ICD-10-CM | POA: Insufficient documentation

## 2013-06-24 LAB — CBC
HCT: 36.7 % (ref 36.0–46.0)
HEMOGLOBIN: 11.7 g/dL — AB (ref 12.0–15.0)
MCH: 23.7 pg — ABNORMAL LOW (ref 26.0–34.0)
MCHC: 31.9 g/dL (ref 30.0–36.0)
MCV: 74.3 fL — ABNORMAL LOW (ref 78.0–100.0)
Platelets: 252 10*3/uL (ref 150–400)
RBC: 4.94 MIL/uL (ref 3.87–5.11)
RDW: 14.1 % (ref 11.5–15.5)
WBC: 6.8 10*3/uL (ref 4.0–10.5)

## 2013-06-24 LAB — COMPREHENSIVE METABOLIC PANEL
ALBUMIN: 3.6 g/dL (ref 3.5–5.2)
ALT: 12 U/L (ref 0–35)
AST: 18 U/L (ref 0–37)
Alkaline Phosphatase: 41 U/L (ref 39–117)
BUN: 7 mg/dL (ref 6–23)
CHLORIDE: 104 meq/L (ref 96–112)
CO2: 25 mEq/L (ref 19–32)
Calcium: 8.9 mg/dL (ref 8.4–10.5)
Creatinine, Ser: 0.88 mg/dL (ref 0.50–1.10)
GFR calc Af Amer: >90 mL/min (ref >90)
GFR calc non Af Amer: 85 mL/min — ABNORMAL LOW (ref >90)
Glucose, Bld: 92 mg/dL (ref 70–99)
POTASSIUM: 3.6 meq/L — AB (ref 3.7–5.3)
Sodium: 140 mEq/L (ref 137–147)
TOTAL PROTEIN: 7.2 g/dL (ref 6.0–8.3)
Total Bilirubin: 0.3 mg/dL (ref 0.3–1.2)

## 2013-06-24 NOTE — ED Notes (Signed)
Per PA Greta DoomBowie "pt felt we were not treating her symptoms and that something should be done further in the ED than making her follow up with primary care provider".  This RN not able to assess pt prior to pt leaving AMA.  Pt departed without discharge papers.

## 2013-06-24 NOTE — ED Notes (Addendum)
X-ray paged, pt available for transport.  Pt informed about transport to x-ray.

## 2013-06-24 NOTE — ED Provider Notes (Signed)
Medical screening examination/treatment/procedure(s) were performed by non-physician practitioner and as supervising physician I was immediately available for consultation/collaboration.  EKG Interpretation   None         Shelda JakesScott W. Quanesha Klimaszewski, MD 06/24/13 1055

## 2013-06-24 NOTE — ED Notes (Signed)
Pt reports coughing up blood colored sputum that started last night with only 2 episodes.  Pt teeth are intact.  Pt swished with salt water.  Pt currently coughing up white sputum in ED, no blood.

## 2013-06-24 NOTE — ED Notes (Signed)
Pt reports she was lying down this morning and felt like there was blood in her mouth, she spit and there was blood in it. She gargled with salt water and there was more blood in it. Since then she has not noticed any more blood with her spit. She denies any pain or other complaints.

## 2013-06-24 NOTE — Discharge Instructions (Signed)
Please followup with your primary care Dr. for further evaluation. Return if your bleeding is persistent, having lightheadedness, dizziness or passing out. As for your rash, continue to use over-the-counter hydrocortisone cream or Lamisil and followup with dermatologist for further care.

## 2013-06-24 NOTE — ED Provider Notes (Signed)
CSN: [id number]     Arrival date & time 06/24/13  0907 History   First MD Initiated Contact with Patient 06/24/13 [phone]     Chief Complaint  Patient presents with  . Hemoptysis     (Consider location/radiation/quality/duration/timing/severity/associated sxs/prior Treatment) HPI  33 year old female with history of tachycardia, anxiety, and unspecified chest pain presents with blood in mouth.  Pt sts this am around 3:30 she was watching TV and notice blood taste in her mouth.  States she tried to stay did notice some blood in it. Quantify as a teaspoon amount. She rinsed her mouth with salt water and later on she noticed more bleeding was roughly the same amount. This concerned her prompting her to come to ER for further evaluation. She denies any abnormal bleeding the past. Denies any bleeding from the teeth. Denies any nosebleeds. She denies coughing up blood all vomiting of blood. Denies any lightheadedness, dizziness, headache, chest pain, shortness of breath, abdominal pain. No prior history of coagulopathy. No prior history of alcohol abuse, or history of PE or DVT. She did not have any active bleeding in the ED.  Patient also requests for evaluation of itchy rash on her skin. States she has had dry patch of skin on both side of the abdomen and on both of forearms ongoing for several months. She has tried over-the-counter hydrocortisone cream as well as Lamisil with some relief however it has not fully resolved. Denies any change in environment, medication change, new pets, or new personal products. She tries to followup with dermatology but states the appointment isn't until April.  Past Medical History  Diagnosis Date  . Anxiety   . Tachycardia   . Goiter, unspecified   . Chest pain, unspecified    Past Surgical History  Procedure Laterality Date  . External ear surgery    . Tonsillectomy     Family History  Problem Relation Age of Onset  . Anesthesia problems Neg Hx   .  Hypotension Neg Hx   . Pseudochol deficiency Neg Hx   . Malignant hyperthermia Neg Hx   . Diabetes Mother   . Hypertension Mother   . Hypertension Father   . Heart disease Father    History  Substance Use Topics  . Smoking status: Former Smoker    Quit date: 05/05/2005  . Smokeless tobacco: Never Used  . Alcohol Use: No     Comment: occasional.   OB History   Grav Para Term Preterm Abortions TAB SAB Ect Mult Living   1    1  1    0     Review of Systems  All other systems reviewed and are negative.      Allergies  Amoxicillin; Penicillins; and Metronidazole  Home Medications   Current Outpatient Rx  Name  Route  Sig  Dispense  Refill  . acetaminophen (TYLENOL) 500 MG tablet   Oral   Take 1,000 mg by mouth every 6 (six) hours as needed for headache.         Marland Kitchen omeprazole (PRILOSEC) 40 MG capsule   Oral   Take 40 mg by mouth 2 (two) times daily as needed (for acid reflux.).          BP 132/58  Pulse 88  Temp(Src) 98.5 F (36.9 C) (Oral)  Resp 18  Ht 5\' 4"  (1.626 m)  Wt 198 lb (89.812 kg)  BMI 33.97 kg/m2  SpO2 100% Physical Exam  Nursing note and vitals reviewed. Constitutional: She appears well-developed  and well-nourished. No distress.  HENT:  Head: Atraumatic.  Mouth/Throat: Oropharynx is clear and moist.  Mouth: Normal appearing thumb, no active signs of bleeding.  Nose: Nares normal, no signs of bleeding  Eyes: Conjunctivae are normal.  Neck: Neck supple.  Cardiovascular: Normal rate and regular rhythm.   Pulmonary/Chest: Effort normal and breath sounds normal. She has no wheezes. She has no rales. She exhibits no tenderness.  Abdominal: Soft. There is no tenderness.  Neurological: She is alert.  Skin: Rash (patient has several small hypopigmented dry skin noted on the anterior abdominal wall bilaterally and several linear hypopigmented skin changes on both forearms. No evidence of infection. No pustular, petechia, or vesicular lesion. No  cellulitic changes) noted.  Psychiatric: She has a normal mood and affect.    ED Course  Procedures (including critical care time)  Patient here with c/o blood in mouth. She has no active bleeding. Blood work with normal platelets and a hemoglobin of 11.7. No systemic changes. No hemoptysis or hematemesis. No change in the stools. Unsure of the source of her bleeding but she is hemodynamically stable. Chest x-ray is unremarkable. She also has rash that appears to be eczema. No red flags. I recommend close followup with PCP for further care. Patient currently stable for discharge.  Labs Review Labs Reviewed  CBC - Abnormal; Notable for the following:    Hemoglobin 11.7 (*)    MCV 74.3 (*)    MCH 23.7 (*)    All other components within normal limits  COMPREHENSIVE METABOLIC PANEL - Abnormal; Notable for the following:    Potassium 3.6 (*)    GFR calc non Af Amer 85 (*)    All other components within normal limits   Imaging Review Dg Chest 2 View  06/24/2013   CLINICAL DATA:  Hemoptysis, awoke with blood in her mouth of unknown origin, history arrhythmia  EXAM: CHEST  2 VIEW  COMPARISON:  02/04/2011 ; correlates in CTA chest 04/27/2013  FINDINGS: Normal heart size, mediastinal contours, and pulmonary vascularity.  Lungs clear.  No pleural effusion or pneumothorax.  Bones unremarkable.  IMPRESSION: Normal exam.   Electronically Signed   By: Ulyses SouthwardMark  Boles M.D.   On: 06/24/2013 10:09    EKG Interpretation   None       MDM   Final diagnoses:  Rash  Bloody taste in mouth    BP 132/58  Pulse 88  Temp(Src) 98.5 F (36.9 C) (Oral)  Resp 18  Ht 5\' 4"  (1.626 m)  Wt 198 lb (89.812 kg)  BMI 33.97 kg/m2  SpO2 100%  I have reviewed nursing notes and vital signs. I personally reviewed the imaging tests through PACS system  I reviewed available ER/hospitalization records thought the EMR     Fayrene HelperBowie Mad[facility]x First, [place] JerseyPA-C 06/24/13 1039

## 2013-07-28 ENCOUNTER — Encounter (HOSPITAL_COMMUNITY): Payer: Self-pay | Admitting: Emergency Medicine

## 2013-07-28 ENCOUNTER — Emergency Department (INDEPENDENT_AMBULATORY_CARE_PROVIDER_SITE_OTHER)
Admission: EM | Admit: 2013-07-28 | Discharge: 2013-07-28 | Disposition: A | Discharge status: Home / Self Care | Payer: BC Managed Care – PPO | Source: Home / Self Care | Attending: Emergency Medicine | Admitting: Emergency Medicine

## 2013-07-28 DIAGNOSIS — J309 Allergic rhinitis, unspecified: Secondary | ICD-10-CM

## 2013-07-28 DIAGNOSIS — H6692 Otitis media, unspecified, left ear: Secondary | ICD-10-CM

## 2013-07-28 MED ORDER — METHYLPREDNISOLONE ACETATE 80 MG/ML IJ SUSP
INTRAMUSCULAR | Status: AC
  Filled 2013-07-28: qty 1

## 2013-07-28 MED ORDER — FLUTICASONE PROPIONATE 50 MCG/ACT NA SUSP: 2.0000 | Freq: Every day | NASAL | Status: DC

## 2013-07-28 MED ORDER — FLUCONAZOLE 150 MG PO TABS
150.0000 mg | ORAL_TABLET | Freq: Once | ORAL | Status: DC
Start: 2013-07-28 — End: 2013-10-06

## 2013-07-28 MED ORDER — PREDNISONE 20 MG PO TABS: ORAL_TABLET | ORAL | Status: DC

## 2013-07-28 MED ORDER — DOXYCYCLINE HYCLATE 100 MG PO TABS: 100.0000 mg | ORAL_TABLET | Freq: Two times a day (BID) | ORAL | Status: DC

## 2013-07-28 MED ORDER — BETAMETHASONE DIPROPIONATE AUG 0.05 % EX CREA: TOPICAL_CREAM | Freq: Two times a day (BID) | CUTANEOUS | Status: DC

## 2013-07-28 MED ORDER — METHYLPREDNISOLONE ACETATE 80 MG/ML IJ SUSP
80.0000 mg | Freq: Once | INTRAMUSCULAR | Status: AC
  Administered 2013-07-28: 80 mg via INTRAMUSCULAR

## 2013-07-28 MED ORDER — CETIRIZINE-PSEUDOEPHEDRINE ER 5-120 MG PO TB12: 1.0000 | ORAL_TABLET | Freq: Every day | ORAL | Status: DC

## 2013-07-28 NOTE — Discharge Instructions (Signed)
People who suffer from allergies frequently have symptoms of nasal congestion, runny nose, sneezing, itching of the nose, eyes, ears or throat, mucous in the throat, watering of the eyes and cough.  These symptoms are caused by the body's immune response to environmental allergens.  For seasonal allergies this is pollen (tree pollen in the spring, grass pollen in the [place], and weed pollen in the fall).  Year round allergy symptoms are usually caused by dust or mould.  Many people have year round symptoms which are worse seasonally. ° °For people who have seasonal allergies, pollen avoidance may help to decease symptoms.  This means keeping windows in the house down and windows in the car up.  Run your air conditioning, since this filters out many of the pollen particles.  If you have to spend a prolonged time outdoors during heavy pollen season, it might be prudent to wear a mask.  These can be purchased at any drug store.  When you come in after heavy pollen exposure, your skin, clothing and hair are covered with pollen.  Changing your clothing, taking a shower, and washing your hair may help with your pollen exposure.  Also, your bedding, pillow, and pillowcase may become contaminated with pollen, so frequent washing of your bedding and pillowcase and changing out your pillow may help as well.  (Your pillow can also be a source of dust and mould exposure as well.)  Showering at bedtime may also help. ° °During heavy pollen season (April and September), a large amount of pollen gets trapped in your nasal cavity.  This can contribute to ongoing allergy symptoms.  Saline irrigation of the nasal cavity can help to remove this and relieve allergy symptoms.  This can be accomplished in several ways.  You can mix up your own saline solution using the following recipe:  8 oz of distilled or boiled water, 1/2 tsp of table salt (sodium choride), and a pinch of baking soda (sodium bicarbonate).  If nasal congestion is a  problem.  1 to 2 drops of Afrin solution can be added to this as well.  To do the irrigation, purchase a nasal bulb syringe (the kind you would use to clean out an infant's nose).  Fill this up with the solution, lean you head over a sink with the nostril to be irrigated turned upward, insert the syringe into your nostril, making a tight seal, and gently irrigate, compressing the bulb.  The solution will flow into your nostril and out the other, some may also come out of your mouth.  Repeat this on both sides.  You can do this once daily.  Do not store the solution, mix it up fresh each day.  A commercial solution, called Neomed Solution, can be purchased over the counter without prescription.  You can also use a Netti Pot for irrigation.  These can be purchased at your drug store as well.  Be sure to use distilled or boiled water in these as well and make sure the Netti pot is completely dry between uses. ° °Over the counter medications can be helpful, and in many cases can completely control allergy symptoms without resorting to more expensive prescription meds.   °Antihistamines are the mainstay of allergy treatment.  The newer non-sedating antihistamines are all available over the counter.  These include Allegra, Zyrtec, and Claritin which also can be purchased in their generic forms: fexofenadine, cetirizine, and  Cetirizine.  Combining these meds with a decongestant such as pseudoephedrine or   phenylephrine helps with nasal congestion, but decongestants can also cause elevations in blood pressure.  Pseudoephedrine tends to be more effective than phenylephrine.  The older, more sedating antihistamines such as chlorpheniramine, brompheniramine, and diphenhydramine are also very effective, sometimes more so than the newer antihistamines, but with the price of more sedation.  You should be careful about driving or operating heavy machinery when taking sedating antihistamines, and men with enlarged prostates may  experience urinary retention with diphenhydramine. ° °Naslacrom nasal spray can be very effective for allergy symptoms.  It is available over the counter and has very few side effects.  The dosage is 2 sprays in each nostril twice daily.  It is recommended that you pinch your nose shut for 30 seconds after using it since it is a watery spray and can run out.  It can be used as long as needed.  There is no risk of dependency. ° °For people with year round allergies, dust, mould, insect emanations, and pet dander are usually the culprits. ° °To avoid dust, you need to avoid dust mites which are the main source of allergens in house dust.  Cover your bedding with moisture and mite impervious covers.  These can be purchased at any mattress store.  The modern covers are a little expensive, but not at all uncomfortable. Keeping your house as dry as possible will also help to control dust mites.  Do not use a humidifier and it may help to use a dehumidifier.  Use of a HEPA filter air filter is also a great way to reduce dust and mold exposure.  These units can be purchased commercially.  Make sure to buy one large enough for the room you intend to use it.  Change the filter as per the manufacturer's instructions.  Also, using a HEPA filter vacuum for your carpets is helpful.  There are chemicals that you can sprinkle on your carpet called acaricides that will kill dist mites.  The most commonly used brand is Acarosan.  This can be purchased on line.  It does have to be periodically reapplied.  Wash you pillows and bedsheets regularly in hot water. ° ° ° ° ° ° ° ° ° °

## 2013-07-28 NOTE — ED Notes (Signed)
C/o head congestion onset 2 weeks ago.  No fever. C/o bil. earache and scratchy throat off and on.

## 2013-07-28 NOTE — ED Provider Notes (Signed)
Chief Complaint   Chief Complaint  Patient presents with  . Nasal Congestion    History of Present Illness   Jackie Faulkner is a 33 year old female who has had a two-week history of nasal congestion, rhinorrhea, sneezing, and burning and itching of the nose. She's had a scratchy throat, sore throat, and postnasal drip. Her ears appeared hurting, and she's had a headache and sinus pressure. She denies any fever, cough, or wheezing. She has not had prior history of allergies or pollen sensitivities. Her symptoms began after riding a motorcycle with her boyfriend, and being exposed to outside air.  Review of Systems   Other than as noted above, the patient denies any of the following symptoms. Systemic:  No fever, chills, or headache. Eye:  No redness, itching, watering, pain or drainage. ENT:  No earache, ear congestion, sinus pressure or pain, post nasal drip, or sore throat. Lungs:  No cough, sputum production, wheezing, or shortness of breath. Skin:  No rash or itching.  PMFSH   Past medical history, family history, social history, meds, and allergies were reviewed.  She is allergic to penicillin, amoxicillin, and Flagyl.  Physical Exam     Vital signs:  BP 113/75  Pulse 79  Temp(Src) 99 F (37.2 C) (Oral)  Resp 16  SpO2 100%  LMP 07/20/2013 General:  Alert, in no distress. Eye:  No conjunctival injection or drainage. Lids were normal. ENT:  The left TM was erythematous, right TM was normal.  Nasal mucosa was congested, pale and boggy with clear drainage.  Mucous membranes were moist.  Pharynx was clear, without exudate or drainage.  There were no oral ulcerations or lesions. Neck:  Supple, no adenopathy, tenderness or mass. Lungs:  No respiratory distress.  Lungs were clear to auscultation, without wheezes, rales or rhonchi.  Breath sounds were clear and equal bilaterally. Heart:  Regular rhythm, without gallops, murmers or rubs. Skin:  Clear, warm, and dry, without rash  or lesions.  Course in Urgent [facility] Center   Given Depo-Medrol 80 mg IM.  Assessment   The primary encounter diagnosis was Allergic rhinitis. A diagnosis of Left otitis media was also pertinent to this visit.  Plan     1.  Meds:  The following meds were prescribed:   Discharge Medication List as of 07/28/2013  5:54 PM    START taking these medications   Details  augmented betamethasone dipropionate (DIPROLENE AF) 0.05 % cream Apply topically 2 (two) times daily., Starting 07/28/2013, Until Discontinued, Normal    cetirizine-pseudoephedrine (ZYRTEC-D) 5-120 MG per tablet Take 1 tablet by mouth daily., Starting 07/28/2013, Until Discontinued, Normal    doxycycline (VIBRA-TABS) 100 MG tablet Take 1 tablet (100 mg total) by mouth 2 (two) times daily., Starting 07/28/2013, Until Discontinued, Normal    fluconazole (DIFLUCAN) 150 MG tablet Take 1 tablet (150 mg total) by mouth once., Starting 07/28/2013, Normal    fluticasone (FLONASE) 50 MCG/ACT nasal spray Place 2 sprays into both nostrils daily., Starting 07/28/2013, Until Discontinued, Normal    predniSONE (DELTASONE) 20 MG tablet Take 3 daily for 5 days, 2 daily for 5 days, 1 daily for 5 days., Normal        2.  Patient Education/Counseling:  The patient was given appropriate handouts, self care instructions, and instructed in symptomatic relief. The patient was instructed in allergen avoidance.    3.  Follow up:  The patient was told to follow up here if no better in 3 to 4 days, or  sooner if becoming worse in any way, and given some red flag symptoms such as fever or difficulty breathing which would prompt immediate return.  Follow up with Dr. [place] Callas if no improvement in 2 weeks.        Reuben Likes, MD 07/28/13 [id number]

## 2013-09-30 ENCOUNTER — Ambulatory Visit (HOSPITAL_COMMUNITY)
Admission: AD | Admit: 2013-09-30 | Discharge: 2013-09-30 | Disposition: A | Discharge status: Home / Self Care | Payer: BC Managed Care – PPO | Source: Ambulatory Visit | Attending: Obstetrics & Gynecology | Admitting: Obstetrics & Gynecology

## 2013-09-30 LAB — ABO/RH: ABO/RH(D): A POS

## 2013-10-03 DIAGNOSIS — O009 Unspecified ectopic pregnancy without intrauterine pregnancy: Secondary | ICD-10-CM

## 2013-10-03 HISTORY — DX: Unspecified ectopic pregnancy without intrauterine pregnancy: O00.90

## 2013-10-05 ENCOUNTER — Inpatient Hospital Stay (HOSPITAL_COMMUNITY)
Admission: AD | Admit: 2013-10-05 | Discharge: 2013-10-05 | Disposition: A | Discharge status: Home / Self Care | Payer: BC Managed Care – PPO | Source: Ambulatory Visit | Attending: Obstetrics & Gynecology | Admitting: Obstetrics & Gynecology

## 2013-10-05 ENCOUNTER — Encounter (HOSPITAL_COMMUNITY): Payer: Self-pay | Admitting: *Deleted

## 2013-10-05 DIAGNOSIS — O009 Unspecified ectopic pregnancy without intrauterine pregnancy: Secondary | ICD-10-CM

## 2013-10-05 DIAGNOSIS — O00109 Unspecified tubal pregnancy without intrauterine pregnancy: Secondary | ICD-10-CM | POA: Insufficient documentation

## 2013-10-05 DIAGNOSIS — Z87891 Personal history of nicotine dependence: Secondary | ICD-10-CM | POA: Insufficient documentation

## 2013-10-05 HISTORY — DX: Dermatitis, unspecified: L30.9

## 2013-10-05 LAB — CBC WITH DIFFERENTIAL/PLATELET
Basophils Absolute: 0 10*3/uL (ref 0.0–0.1)
Basophils Relative: 0 % (ref 0–1)
EOS PCT: 1 % (ref 0–5)
Eosinophils Absolute: 0.1 10*3/uL (ref 0.0–0.7)
HCT: 34.2 % — ABNORMAL LOW (ref 36.0–46.0)
Hemoglobin: 11 g/dL — ABNORMAL LOW (ref 12.0–15.0)
LYMPHS ABS: 2.2 10*3/uL (ref 0.7–4.0)
Lymphocytes Relative: 20 % (ref 12–46)
MCH: 24.1 pg — ABNORMAL LOW (ref 26.0–34.0)
MCHC: 32.2 g/dL (ref 30.0–36.0)
MCV: 74.8 fL — AB (ref 78.0–100.0)
Monocytes Absolute: 0.4 10*3/uL (ref 0.1–1.0)
Monocytes Relative: 4 % (ref 3–12)
NEUTROS ABS: 8.2 10*3/uL — AB (ref 1.7–7.7)
NEUTROS PCT: 76 % (ref 43–77)
Platelets: 270 10*3/uL (ref 150–400)
RBC: 4.57 MIL/uL (ref 3.87–5.11)
RDW: 14.4 % (ref 11.5–15.5)
WBC: 10.9 10*3/uL — ABNORMAL HIGH (ref 4.0–10.5)

## 2013-10-05 LAB — HCG, QUANTITATIVE, PREGNANCY: hCG, Beta Chain, Quant, S: 3519 m[IU]/mL — ABNORMAL HIGH (ref <5)

## 2013-10-05 LAB — AST: AST: 13 U/L (ref 0–37)

## 2013-10-05 LAB — CREATININE, SERUM: CREATININE: 0.83 mg/dL (ref 0.50–1.10)

## 2013-10-05 LAB — BUN: BUN: 7 mg/dL (ref 6–23)

## 2013-10-05 MED ORDER — METHOTREXATE INJECTION FOR WOMEN'S HOSPITAL
50.0000 mg/m2 | Freq: Once | INTRAMUSCULAR | Status: AC
  Administered 2013-10-05: 100 mg via INTRAMUSCULAR
  Filled 2013-10-05: qty 2

## 2013-10-05 NOTE — Discharge Instructions (Signed)
Methotrexate Treatment for an Ectopic Pregnancy °Care After °Refer to this sheet in the next few weeks. These instructions provide you with information on caring for yourself after your treatment. Your caregiver may also give you more specific instructions. Your treatment has been planned according to current medical practices, but problems sometimes occur. Call your caregiver if you have any problems or questions after your treatment. °HOME CARE INSTRUCTIONS  °After you have received the methotrexate medicine, you need to be careful of your activities and watch your condition for several weeks. Even with methotrexate, an ectopic pregnancy can begin to bleed without warning. It may take 1 week before your pregnancy hormone level starts to drop. °· Keep all follow-up blood work appointments as directed by your caregiver. This is very important. Blood work is usually done in 4 to 7 days, and then at weekly visits until there is no pregnancy hormone detected. °· Avoid traveling too far away from your caregiver. °· Do not have sexual intercourse until your caregiver says it is safe to do so. °· You may resume your usual diet. °· Limit strenuous activity. °· Do not take folic acid, prenatal vitamins, or other vitamins that contain folic acid. °· Do not take aspirin, ibuprofen, or naproxen (nonsteroidal anti-inflammatory drugs, NSAIDs). °· Do not drink alcohol. °SEEK MEDICAL CARE IF:  °· You cannot control your nausea and vomiting. °· You cannot control your diarrhea. °· You have sores in your mouth and want treatment. °· You need pain medicine for your abdominal pain. °· You have a rash. °· You are having a reaction to the medicine. °SEEK IMMEDIATE MEDICAL CARE IF:  °· You experience increasing abdominal or pelvic pain. °· You notice increased bleeding. °· You feel lightheaded or you faint. °· You have shortness of breath. °· Your heart rate increases. °· You have a cough. °· You have chills. °· You have a fever. °MAKE  SURE YOU: °· Understand these instructions. °· Will watch your condition. °· Will get help right away if you are not doing well or get worse. °Document Released: 04/10/2011 Document Revised: 07/14/2011 Document Reviewed: 04/10/2011 °ExitCare® Patient Information ©2014 [employer]. ° °

## 2013-10-05 NOTE — MAU Note (Addendum)
Dx with ectopic through [place]. Sent over from office for injection.  Pain in left side started yesterday.  Has been having bleeding off and on

## 2013-10-05 NOTE — MAU Provider Note (Signed)
History     CSN: [id number]  Arrival date and time: 10/05/13 1535   First Provider Initiated Contact with Patient 10/05/13 1616      Chief Complaint  Patient presents with  . Ectopic Pregnancy   HPI Ms. Jackie Faulkner is a 33 y.o. G2P0010 at Unknown who presents to MAU from the office for MTX. The patient had a confirmed ectopic pregnancy on US in the office today. She states intermittent pain, but none now. She states bleeding has been sporadic as well. She denies heavy bleeding today. She denies fever or N/V.   OB History   Grav Para Term Preterm Abortions TAB SAB Ect Mult Living   2    1  1    0      Past Medical History  Diagnosis Date  . Anxiety   . Tachycardia   . Goiter, unspecified   . Chest pain, unspecified   . Eczema     Past Surgical History  Procedure Laterality Date  . External ear surgery    . Tonsillectomy      Family History  Problem Relation Age of Onset  . Anesthesia problems Neg Hx   . Hypotension Neg Hx   . Pseudochol deficiency Neg Hx   . Malignant hyperthermia Neg Hx   . Diabetes Mother   . Hypertension Mother   . Hypertension Father   . Heart disease Father     History  Substance Use Topics  . Smoking status: Former Smoker    Quit date: 05/05/2005  . Smokeless tobacco: Never Used  . Alcohol Use: Yes     Comment: occasional.    Allergies:  Allergies  Allergen Reactions  . Amoxicillin Hives  . Penicillins Hives    Her mother is highly allergic so she chooses not to risk adverse side effects.  . Metronidazole Hives    Prescriptions prior to admission  Medication Sig Dispense Refill  . acetaminophen (TYLENOL) 500 MG tablet Take 1,000 mg by mouth every 6 (six) hours as needed for headache.      . augmented betamethasone dipropionate (DIPROLENE AF) 0.05 % cream Apply topically 2 (two) times daily.  30 g  5  . cetirizine-pseudoephedrine (ZYRTEC-D) 5-120 MG per tablet Take 1 tablet by mouth daily.  30 tablet  12  . doxycycline  (VIBRA-TABS) 100 MG tablet Take 1 tablet (100 mg total) by mouth 2 (two) times daily.  20 tablet  0  . ferrous sulfate 325 (65 FE) MG tablet Take 325 mg by mouth daily with breakfast.      . fluconazole (DIFLUCAN) 150 MG tablet Take 1 tablet (150 mg total) by mouth once.  1 tablet  5  . fluticasone (FLONASE) 50 MCG/ACT nasal spray Place 2 sprays into both nostrils daily.  16 g  12  . omeprazole (PRILOSEC) 40 MG capsule Take 40 mg by mouth 2 (two) times daily as needed (for acid reflux.).      Marland Kitchen. predniSONE (DELTASONE) 20 MG tablet Take 3 daily for 5 days, 2 daily for 5 days, 1 daily for 5 days.  30 tablet  0    Review of Systems  Constitutional: Negative for fever and malaise/fatigue.  Gastrointestinal: Negative for nausea, vomiting, abdominal pain and diarrhea.  Genitourinary:       Neg - vaginal bleeding   Physical Exam   Blood pressure 113/68, pulse 82, temperature 98.6 F (37 C), temperature source Oral, resp. rate 18, height 5\' 3"  (1.6 m), weight 198 lb (89.812  kg), last menstrual period 08/15/2013.  Physical Exam  Constitutional: She is oriented to person, place, and time. She appears well-developed and well-nourished. No distress.  HENT:  Head: Normocephalic.  Cardiovascular: Normal rate.   Respiratory: Effort normal.  GI: Soft. She exhibits no distension and no mass. There is no tenderness. There is no rebound and no guarding.  Neurological: She is alert and oriented to person, place, and time.  Skin: Skin is warm and dry. No erythema.  Psychiatric: She has a normal mood and affect.   Results for orders placed during the hospital encounter of 10/05/13 (from the past 24 hour(s))  CBC WITH DIFFERENTIAL     Status: Abnormal   Collection Time    10/05/13  4:43 PM      Result Value Ref Range   WBC 10.9 (*) 4.0 - 10.5 K/uL   RBC 4.57  3.87 - 5.11 MIL/uL   Hemoglobin 11.0 (*) 12.0 - 15.0 g/dL   HCT 16.1 (*) 09.6 - 04.5 %   MCV 74.8 (*) 78.0 - 100.0 fL   MCH 24.1 (*) 26.0 -  34.0 pg   MCHC 32.2  30.0 - 36.0 g/dL   RDW 40.9  81.1 - 91.4 %   Platelets 270  150 - 400 K/uL   Neutrophils Relative % 76  43 - 77 %   Neutro Abs 8.2 (*) 1.7 - 7.7 K/uL   Lymphocytes Relative 20  12 - 46 %   Lymphs Abs 2.2  0.7 - 4.0 K/uL   Monocytes Relative 4  3 - 12 %   Monocytes Absolute 0.4  0.1 - 1.0 K/uL   Eosinophils Relative 1  0 - 5 %   Eosinophils Absolute 0.1  0.0 - 0.7 K/uL   Basophils Relative 0  0 - 1 %   Basophils Absolute 0.0  0.0 - 0.1 K/uL  AST     Status: None   Collection Time    10/05/13  4:43 PM      Result Value Ref Range   AST 13  0 - 37 U/L  BUN     Status: None   Collection Time    10/05/13  4:43 PM      Result Value Ref Range   BUN 7  6 - 23 mg/dL  CREATININE, SERUM     Status: None   Collection Time    10/05/13  4:43 PM      Result Value Ref Range   Creatinine, Ser 0.83  0.50 - 1.10 mg/dL   GFR calc non Af Amer >90  >90 mL/min   GFR calc Af Amer >90  >90 mL/min  HCG, QUANTITATIVE, PREGNANCY     Status: Abnormal   Collection Time    10/05/13  4:44 PM      Result Value Ref Range   hCG, Beta Chain, Quant, S 3519 (*) <5 mIU/mL     MAU Course  Procedures None  MDM Discussed patient with Dr. Langston Masker who saw the patient in the office. Order pre-MTX labs. If normal given MTX today. Patient to follow-up in MAU on day 4 and 7. Consult on-call MD with results. If appropriate patient will need to follow-up in the office for weekly labs after day 7 labs.  MTX given today Discussed at length with patient diagnosis and expectations for follow-up and possible side effects of the MTX  Assessment and Plan  A: Ectopic pregnancy s/p MTX  P: Discharge home Bleeding/Ectopic precautions discussed Discussed care after MTX  Advised discontinued use of NSAIDs and prenatal vitamins Patient advised to follow-up in MAU on Saturday for day #4 quant hCG or sooner as needed or if her condition were to change or worsen  Freddi Starr, PA-C  10/05/2013, 6:49 PM

## 2013-10-06 ENCOUNTER — Inpatient Hospital Stay (HOSPITAL_COMMUNITY)
Admission: AD | Admit: 2013-10-06 | Discharge: 2013-10-06 | Disposition: A | Discharge status: Home / Self Care | Payer: BC Managed Care – PPO | Source: Ambulatory Visit | Attending: Obstetrics and Gynecology | Admitting: Obstetrics and Gynecology

## 2013-10-06 ENCOUNTER — Inpatient Hospital Stay (HOSPITAL_COMMUNITY): Payer: BC Managed Care – PPO

## 2013-10-06 ENCOUNTER — Encounter (HOSPITAL_COMMUNITY): Payer: Self-pay | Admitting: General Practice

## 2013-10-06 DIAGNOSIS — O00109 Unspecified tubal pregnancy without intrauterine pregnancy: Secondary | ICD-10-CM | POA: Insufficient documentation

## 2013-10-06 DIAGNOSIS — R1032 Left lower quadrant pain: Secondary | ICD-10-CM | POA: Insufficient documentation

## 2013-10-06 DIAGNOSIS — O009 Unspecified ectopic pregnancy without intrauterine pregnancy: Secondary | ICD-10-CM

## 2013-10-06 DIAGNOSIS — Z87891 Personal history of nicotine dependence: Secondary | ICD-10-CM | POA: Insufficient documentation

## 2013-10-06 LAB — ABO/RH: ABO/RH(D): A POS

## 2013-10-06 LAB — CBC
HCT: 33.9 % — ABNORMAL LOW (ref 36.0–46.0)
Hemoglobin: 11 g/dL — ABNORMAL LOW (ref 12.0–15.0)
MCH: 23.9 pg — AB (ref 26.0–34.0)
MCHC: 32.4 g/dL (ref 30.0–36.0)
MCV: 73.5 fL — ABNORMAL LOW (ref 78.0–100.0)
Platelets: 276 10*3/uL (ref 150–400)
RBC: 4.61 MIL/uL (ref 3.87–5.11)
RDW: 14.6 % (ref 11.5–15.5)
WBC: 13.4 10*3/uL — ABNORMAL HIGH (ref 4.0–10.5)

## 2013-10-06 MED ORDER — ONDANSETRON 8 MG PO TBDP: 8.0000 mg | ORAL_TABLET | Freq: Once | ORAL | Status: DC

## 2013-10-06 MED ORDER — OXYCODONE-ACETAMINOPHEN 5-325 MG PO TABS
2.0000 | ORAL_TABLET | Freq: Once | ORAL | Status: AC
  Administered 2013-10-06: 2 via ORAL
  Filled 2013-10-06: qty 2

## 2013-10-06 MED ORDER — OXYCODONE-ACETAMINOPHEN 5-325 MG PO TABS: 2.0000 | ORAL_TABLET | Freq: Once | ORAL | Status: DC

## 2013-10-06 MED ORDER — DEXTROSE IN LACTATED RINGERS 5 % IV SOLN: INTRAVENOUS | Status: DC

## 2013-10-06 MED ORDER — ONDANSETRON 8 MG PO TBDP
8.0000 mg | ORAL_TABLET | Freq: Once | ORAL | Status: AC
Start: 2013-10-06 — End: 2013-10-06
  Administered 2013-10-06: 8 mg via ORAL
  Filled 2013-10-06: qty 1

## 2013-10-06 NOTE — Discharge Instructions (Signed)
Methotrexate Treatment for an Ectopic Pregnancy An ectopic pregnancy is when the fertilized egg attaches (implants) outside the uterus. Most ectopic pregnancies occur in the fallopian tube. Rarely do ectopic pregnancies occur on the ovary, intestine, pelvis, or cervix. An ectopic pregnancy does not have the ability to develop into a normal, healthy baby. Having an ectopic pregnancy can be a life-threatening experience. However, if the ectopic pregnancy is found early enough, it can be treated with a medicine. This medicine is called methotrexate. Methotrexate works by stopping the pregnancy from growing. It helps the body absorb the pregnancy tissue over a 2 to 6 week period (though most pregnancies will be absorbed by 3 weeks).  If methotrexate is successful, there is a good chance that the fallopian tube may be saved. Regardless of whether the fallopian tube is saved, a mother who has had an ectopic pregnancy is at a much higher risk of having another ectopic occur in future pregnancies. One serious concern is the potential for the fallopian tube to tear (rupture). If it does, emergency surgery is needed to remove the pregnancy, and methotrexate cannot be used. The ideal patient for methotrexate is a person who is:   Not bleeding internally.  Has no severe or persistent abdominal pain.  Is committed to following through with lab tests and appointments until the ectopic has absorbed.  Is healthy and has normal liver and kidney functions on evaluation. Methotrexate should not be given to women who:  Are breastfeeding.  Have a normal pregnancy (intrauterine pregnancy).  Have liver, lung, or kidney disease.  Have blood problems.  Are allergic to methotrexate.  Have peptic ulcers.  Have an ectopic pregnancy larger than 1 inches (3.5 cm) or one that has fetal heartbeats. This is a rule that is followed most of the time (relative contraindication). BEFORE THE TREATMENT Before giving the  medicine:  Liver tests, kidney tests, and a complete blood test are performed.  Blood tests are performed to measure the pregnancy hormone levels and to determine the mother's blood type.  If the woman is Rh negative, and the father is Rh positive or his Rh type is not known, a RhoGAM shot is given. TREATMENT  There are 2 methods that your caregiver may use to prescribe methotrexate. One method involves a single dose or injection of the medicine. Another method involves a series of doses. This method involves several injections.  AFTER THE TREATMENT Blood tests will be taken for several weeks to check the pregnancy hormone levels. The blood tests are performed until there is no more pregnancy hormone detected in the blood. There is still a risk of the ectopic pregnancy rupturing while using the methotrexate. There are also side effects of methotrexate, which include:   Nausea and vomiting.  Mouth sores.  Diarrhea.  Rash.  Dizziness.  Increased abdominal pain.  Increased vaginal bleeding or spotting.  Pneumonia.  Failed treatment.  Hair loss. This is rare and reversible. On very rare occasions, the medicine may affect your blood counts, liver, kidney, bone marrow, or hormone levels. If this happens, your caregiver will want to perform further evaluations. Document Released: 04/15/2001 Document Revised: 07/14/2011 Document Reviewed: 12/26/2010 Exit[employer] Patient Information 2014 [employer].   

## 2013-10-06 NOTE — MAU Note (Signed)
Pt presents to MAU with c/o severe abdominal pain following methyltrexate for ectopic pregnancy.

## 2013-10-06 NOTE — MAU Provider Note (Signed)
History     CSN: [id number]  Arrival date and time: 10/06/13 2049   None     No chief complaint on file.  HPI  Pt is s/p methotrexate for ectopic pregnancy yesterday- Dr. Mitchel Honour.  Pt woke up with left lower quadrant pain   Past Medical History  Diagnosis Date  . Anxiety   . Tachycardia   . Goiter, unspecified   . Chest pain, unspecified   . Eczema     Past Surgical History  Procedure Laterality Date  . External ear surgery    . Tonsillectomy      Family History  Problem Relation Age of Onset  . Anesthesia problems Neg Hx   . Hypotension Neg Hx   . Pseudochol deficiency Neg Hx   . Malignant hyperthermia Neg Hx   . Diabetes Mother   . Hypertension Mother   . Hypertension Father   . Heart disease Father     History  Substance Use Topics  . Smoking status: Former Smoker    Quit date: 05/05/2005  . Smokeless tobacco: Never Used  . Alcohol Use: Yes     Comment: occasional.    Allergies:  Allergies  Allergen Reactions  . Amoxicillin Hives  . Penicillins Hives    Her mother is highly allergic so she chooses not to risk adverse side effects.  . Metronidazole Hives    Prescriptions prior to admission  Medication Sig Dispense Refill  . acetaminophen (TYLENOL) 500 MG tablet Take 1,000 mg by mouth every 6 (six) hours as needed for headache.      . augmented betamethasone dipropionate (DIPROLENE AF) 0.05 % cream Apply topically 2 (two) times daily.  30 g  5  . ferrous sulfate 325 (65 FE) MG tablet Take 325 mg by mouth daily with breakfast.      . omeprazole (PRILOSEC) 40 MG capsule Take 40 mg by mouth 2 (two) times daily as needed (for acid reflux.).        Review of Systems  Gastrointestinal: Positive for nausea and abdominal pain. Negative for vomiting, diarrhea and constipation.   Physical Exam   Blood pressure 112/38, pulse 83, temperature 98 F (36.7 C), temperature source Oral, resp. rate 24, last menstrual period 08/15/2013.  Physical Exam   Nursing note and vitals reviewed. Constitutional: She is oriented to person, place, and time. She appears well-developed and well-nourished. She appears distressed.  tearful  HENT:  Head: Normocephalic.  Eyes: Pupils are equal, round, and reactive to light.  Neck: Normal range of motion. Neck supple.  Cardiovascular: Normal rate.   GI: Soft. There is tenderness. There is no rebound.  LLQ tenderness  Musculoskeletal: Normal range of motion.  Neurological: She is alert and oriented to person, place, and time.  Skin: Skin is warm and dry.  Psychiatric: She has a normal mood and affect.    MAU Course  Procedures D5LR CBC ABO RH HCG Bedside ultrasound Results for orders placed during the hospital encounter of 10/06/13 (from the past 24 hour(s))  CBC     Status: Abnormal   Collection Time    10/06/13  9:10 PM      Result Value Ref Range   WBC 13.4 (*) 4.0 - 10.5 K/uL   RBC 4.61  3.87 - 5.11 MIL/uL   Hemoglobin 11.0 (*) 12.0 - 15.0 g/dL   HCT 15.8 (*) 30.9 - 40.7 %   MCV 73.5 (*) 78.0 - 100.0 fL   MCH 23.9 (*) 26.0 - 34.0 pg  MCHC 32.4  30.0 - 36.0 g/dL   RDW 16.114.6  09.611.5 - 04.515.5 %   Platelets 276  150 - 400 K/uL  ABO/RH     Status: None   Collection Time    10/06/13  9:10 PM      Result Value Ref Range   ABO/RH(D) A POS    [place] Ob Comp Less 14 Wks  10/06/2013   CLINICAL DATA:  History of ectopic pregnancy treated with methotrexate. Pain.  EXAM: OBSTETRIC <14 WK US AND TRANSVAGINAL OB US  TECHNIQUE: Both transabdominal and transvaginal ultrasound examinations were performed for complete evaluation of the gestation as well as the maternal uterus, adnexal regions, and pelvic cul-de-sac. Transvaginal technique was performed to assess early pregnancy.  COMPARISON:  05/22/2013  FINDINGS: Intrauterine gestational sac: Small cystic structure in the right upper uterine segment, which may reflect gestational sac or pseudo gestational sac.  Yolk sac:  Not visualize  Embryo:  No  Questionable  gestational sac:  7.3  mm   5 w   2  d  Maternal uterus/adnexae: No uterine/endometrial hemorrhage. No uterine mass. Cervix is closed.  Normal right ovary.  Left ovary is somewhat heterogeneous and prominent measuring 4.1 cm x 2.2 cm by 3.1 cm.  Small amount pelvic free fluid.  IMPRESSION: 1. No convincing ectopic pregnancy. 2. Small cystic structure in the right upper uterine segment which could reflect a gestational sac or pseudo sac. It is mean diameter 7.3 mm which would suggest a 5 week, 2 day gestation. No embryo or yolk sac seen. 3. Small amount pelvic free fluid. 4. No other abnormalities.   Electronically Signed   By: Amie Portlandavid  Ormond M.D.   On: 10/06/2013 22:21   [place] Ob Transvaginal  10/06/2013   CLINICAL DATA:  History of ectopic pregnancy treated with methotrexate. Pain.  EXAM: OBSTETRIC <14 WK US AND TRANSVAGINAL OB US  TECHNIQUE: Both transabdominal and transvaginal ultrasound examinations were performed for complete evaluation of the gestation as well as the maternal uterus, adnexal regions, and pelvic cul-de-sac. Transvaginal technique was performed to assess early pregnancy.  COMPARISON:  05/22/2013  FINDINGS: Intrauterine gestational sac: Small cystic structure in the right upper uterine segment, which may reflect gestational sac or pseudo gestational sac.  Yolk sac:  Not visualize  Embryo:  No  Questionable gestational sac:  7.3  mm   5 w   2  d  Maternal uterus/adnexae: No uterine/endometrial hemorrhage. No uterine mass. Cervix is closed.  Normal right ovary.  Left ovary is somewhat heterogeneous and prominent measuring 4.1 cm x 2.2 cm by 3.1 cm.  Small amount pelvic free fluid.  IMPRESSION: 1. No convincing ectopic pregnancy. 2. Small cystic structure in the right upper uterine segment which could reflect a gestational sac or pseudo sac. It is mean diameter 7.3 mm which would suggest a 5 week, 2 day gestation. No embryo or yolk sac seen. 3. Small amount pelvic free fluid. 4. No other  abnormalities.   Electronically Signed   By: Amie Portlandavid  Ormond M.D.   On: 10/06/2013 22:21  discussed with dr. Henderson Cloudomblin Assessment and Plan  Ectopic s/p MTX F/u Day 4 for repeat HCG Percocet RX   Jackie Faulkner 10/06/2013, 8:56 PM

## 2013-10-08 ENCOUNTER — Inpatient Hospital Stay (HOSPITAL_COMMUNITY)
Admission: AD | Admit: 2013-10-08 | Discharge: 2013-10-08 | Disposition: A | Discharge status: Home / Self Care | Payer: BC Managed Care – PPO | Source: Ambulatory Visit | Attending: Obstetrics and Gynecology | Admitting: Obstetrics and Gynecology

## 2013-10-08 DIAGNOSIS — O009 Unspecified ectopic pregnancy without intrauterine pregnancy: Secondary | ICD-10-CM | POA: Insufficient documentation

## 2013-10-08 DIAGNOSIS — R109 Unspecified abdominal pain: Secondary | ICD-10-CM | POA: Insufficient documentation

## 2013-10-08 LAB — HCG, QUANTITATIVE, PREGNANCY: hCG, Beta Chain, Quant, S: 3447 m[IU]/mL — ABNORMAL HIGH (ref <5)

## 2013-10-08 NOTE — MAU Provider Note (Signed)
CC:  Quant Followup  S: Having intermittent lower abdominal pain and intermittent bleeding.  Had methotrexate for ectopic pregnancy on 10-05-13.  Was seen in MAU again on 10-06-13 for pain but the ectopic was stable.  Was given Percocet which she takes periodically 1/2 tablet.  Pain is not as severe today as on 10-06-13.  Is here today for Day 4 labs.  O:GENERAL: Well-developed, well-nourished female in no acute distress.  HEENT: Normocephalic, atraumatic.  LUNGS: Effort normal  HEART: Regular rate  SKIN: Warm, dry and without erythema  PSYCH: Normal mood and affect Results for orders placed during the hospital encounter of 10/08/13 (from the past 24 hour(s))  HCG, QUANTITATIVE, PREGNANCY     Status: Abnormal   Collection Time    10/08/13  4:59 PM      Result Value Ref Range   hCG, Beta Chain, Quant, S 3447 (*) <5 mIU/mL  Results for AMBA, LIMONES (MRN [id number]) as of 10/08/2013 17:47  Ref. Range 10/05/2013 16:43 10/05/2013 16:44 10/06/2013 21:10 10/06/2013 22:14 10/08/2013 16:59  hCG, Beta Chain, Quant, S Latest Range: <5 mIU/mL  3519 (H)   3447 (H)    A: Stable post methotrexate for ectopic pregnancy  P:Client to return on Tuesday for Day 7 quant To return sooner if having increased pain or bleeding. Client verbalized understanding of these instructions.

## 2013-10-08 NOTE — MAU Note (Signed)
Day 4 post MTX 

## 2013-10-08 NOTE — MAU Note (Signed)
Had to return on Thurs, pain was excruciating, nothing like it today.As long as she is sitting or resting she is ok.when she is up walking or using the restroom she has some discomfort.

## 2013-10-11 ENCOUNTER — Encounter (HOSPITAL_COMMUNITY): Payer: Self-pay | Admitting: *Deleted

## 2013-10-11 ENCOUNTER — Inpatient Hospital Stay (HOSPITAL_COMMUNITY)
Admission: AD | Admit: 2013-10-11 | Discharge: 2013-10-11 | Disposition: A | Discharge status: Home / Self Care | Payer: BC Managed Care – PPO | Source: Ambulatory Visit | Attending: Obstetrics and Gynecology | Admitting: Obstetrics and Gynecology

## 2013-10-11 DIAGNOSIS — O009 Unspecified ectopic pregnancy without intrauterine pregnancy: Secondary | ICD-10-CM

## 2013-10-11 DIAGNOSIS — O00109 Unspecified tubal pregnancy without intrauterine pregnancy: Secondary | ICD-10-CM | POA: Insufficient documentation

## 2013-10-11 DIAGNOSIS — K5900 Constipation, unspecified: Secondary | ICD-10-CM

## 2013-10-11 LAB — HCG, QUANTITATIVE, PREGNANCY: hCG, Beta Chain, Quant, S: 2244 m[IU]/mL — ABNORMAL HIGH (ref <5)

## 2013-10-11 MED ORDER — POLYETHYLENE GLYCOL 3350 17 GM/SCOOP PO POWD: 1.0000 | Freq: Once | ORAL | Status: DC

## 2013-10-11 NOTE — MAU Note (Signed)
Patient to MAU for day 7 BHCG s/p MTX. Patient states she has been having abdominal pain and is constipated and is in a lot of pain. Has not had a BM since before the injection. Has bleeding like a period every day.

## 2013-10-11 NOTE — MAU Provider Note (Signed)
History     CSN: [id number]  Arrival date and time: 10/11/13 1722   None     Chief Complaint  Patient presents with  . Follow-up   HPI pt is here for Day 7 f/u HCG after MTX for ectopic pregnancy.  Pt is very constipated- has not had a BM since prior to MTX.  Pt has been taking stool softner without relief of constipation. Pt's abd cramping comes and goes.  Pt has taken occ Percocet. Pt is continuing to bleed. Garvin Filaonna C Coley, RN Registered Nurse Signed  MAU Note Service date: 10/11/2013 5:35 PM   Patient to MAU for day 7 BHCG s/p MTX. Patient states she has been having abdominal pain and is constipated and is in a lot of pain. Has not had a BM since before the injection. Has bleeding like a period every day.     Past Medical History  Diagnosis Date  . Anxiety   . Tachycardia   . Goiter, unspecified   . Chest pain, unspecified   . Eczema     Past Surgical History  Procedure Laterality Date  . External ear surgery    . Tonsillectomy      Family History  Problem Relation Age of Onset  . Anesthesia problems Neg Hx   . Hypotension Neg Hx   . Pseudochol deficiency Neg Hx   . Malignant hyperthermia Neg Hx   . Diabetes Mother   . Hypertension Mother   . Hypertension Father   . Heart disease Father     History  Substance Use Topics  . Smoking status: Former Smoker    Quit date: 05/05/2005  . Smokeless tobacco: Never Used  . Alcohol Use: Yes     Comment: occasional.    Allergies:  Allergies  Allergen Reactions  . Amoxicillin Hives  . Penicillins Hives    Her mother is highly allergic so she chooses not to risk adverse side effects.  . Metronidazole Hives    Prescriptions prior to admission  Medication Sig Dispense Refill  . acetaminophen (TYLENOL) 500 MG tablet Take 1,000 mg by mouth every 6 (six) hours as needed for headache.      . ferrous sulfate 325 (65 FE) MG tablet Take 325 mg by mouth daily with breakfast.      . omeprazole (PRILOSEC) 40 MG capsule  Take 40 mg by mouth 2 (two) times daily as needed (for acid reflux.).      Marland Kitchen. ondansetron (ZOFRAN-ODT) 8 MG disintegrating tablet Take 1 tablet (8 mg total) by mouth once.  20 tablet  0  . oxyCODONE-acetaminophen (PERCOCET/ROXICET) 5-325 MG per tablet Take 2 tablets by mouth once.  30 tablet  0    Review of Systems  Constitutional: Negative for fever and chills.  Gastrointestinal: Positive for abdominal pain and constipation. Negative for nausea and vomiting.  Genitourinary: Negative for dysuria and urgency.   Physical Exam   Blood pressure 110/59, pulse 79, temperature 98.4 F (36.9 C), temperature source Oral, resp. rate 16, last menstrual period 08/15/2013, SpO2 100.00%.  Physical Exam  Nursing note and vitals reviewed. Constitutional: She is oriented to person, place, and time. She appears well-developed and well-nourished. No distress.  HENT:  Head: Normocephalic.  Eyes: Pupils are equal, round, and reactive to light.  Neck: Normal range of motion.  Cardiovascular: Normal rate.   Respiratory: Effort normal.  GI: Soft. She exhibits no distension. There is no tenderness. There is no rebound and no guarding.  Musculoskeletal: Normal range of  motion.  Neurological: She is alert and oriented to person, place, and time.  Skin: Skin is warm and dry.  Psychiatric: She has a normal mood and affect.    MAU Course  Procedures Results for KIMORRA, COLLUM (MRN [id number]) as of 10/11/2013 18:55  Ref. Range 10/05/2013 16:44 10/06/2013 21:10 10/06/2013 22:14 10/08/2013 16:59 10/11/2013 17:41  hCG, Beta Chain, Quant, S Latest Range: <5 mIU/mL 3519 (H)   3447 (H) 2244 (H)   Pt severely constipated- will give enema Discussed with Dr. Vincente Poli- will have pt f/u in office in one week with Dr. Langston Masker for f/u appointment as well as repeat HCG      Pt given SS enema and got good results with some improvement in abdominal pain    Assessment and Plan  Ectopic pregnancy with MTX Day 7 with appropriate  decrease in HcG F/u in 1 week with Dr. Langston Masker and also have repeat HCG level contsiptation- Miralax, colace  Jean Rosenthal 10/11/2013, 6:53 PM

## 2013-10-12 ENCOUNTER — Encounter (HOSPITAL_COMMUNITY): Payer: BC Managed Care – PPO

## 2013-10-12 ENCOUNTER — Encounter (HOSPITAL_COMMUNITY): Payer: Self-pay

## 2013-10-12 ENCOUNTER — Inpatient Hospital Stay (HOSPITAL_COMMUNITY): Payer: BC Managed Care – PPO

## 2013-10-12 ENCOUNTER — Encounter (HOSPITAL_COMMUNITY)
Admission: AD | Disposition: A | Discharge status: Home / Self Care | Payer: Self-pay | Source: Ambulatory Visit | Attending: Obstetrics and Gynecology

## 2013-10-12 ENCOUNTER — Ambulatory Visit (HOSPITAL_COMMUNITY)
Admission: AD | Admit: 2013-10-12 | Discharge: 2013-10-12 | Disposition: A | Discharge status: Home / Self Care | Payer: BC Managed Care – PPO | Source: Ambulatory Visit | Attending: Obstetrics and Gynecology | Admitting: Obstetrics and Gynecology

## 2013-10-12 ENCOUNTER — Ambulatory Visit: Admit: 2013-10-12 | Payer: Self-pay | Admitting: Obstetrics and Gynecology

## 2013-10-12 DIAGNOSIS — Z87891 Personal history of nicotine dependence: Secondary | ICD-10-CM | POA: Insufficient documentation

## 2013-10-12 DIAGNOSIS — F411 Generalized anxiety disorder: Secondary | ICD-10-CM | POA: Insufficient documentation

## 2013-10-12 DIAGNOSIS — R9389 Abnormal findings on diagnostic imaging of other specified body structures: Secondary | ICD-10-CM | POA: Insufficient documentation

## 2013-10-12 DIAGNOSIS — K219 Gastro-esophageal reflux disease without esophagitis: Secondary | ICD-10-CM | POA: Insufficient documentation

## 2013-10-12 DIAGNOSIS — O00109 Unspecified tubal pregnancy without intrauterine pregnancy: Secondary | ICD-10-CM | POA: Insufficient documentation

## 2013-10-12 HISTORY — PX: DILATION AND CURETTAGE OF UTERUS: SHX78

## 2013-10-12 HISTORY — PX: LAPAROSCOPY: SHX197

## 2013-10-12 HISTORY — DX: Unspecified ectopic pregnancy without intrauterine pregnancy: O00.90

## 2013-10-12 HISTORY — PX: BILATERAL SALPINGECTOMY: SHX5743

## 2013-10-12 LAB — CBC
HCT: 34.2 % — ABNORMAL LOW (ref 36.0–46.0)
Hemoglobin: 11 g/dL — ABNORMAL LOW (ref 12.0–15.0)
MCH: 24.1 pg — AB (ref 26.0–34.0)
MCHC: 32.2 g/dL (ref 30.0–36.0)
MCV: 74.8 fL — AB (ref 78.0–100.0)
Platelets: 286 10*3/uL (ref 150–400)
RBC: 4.57 MIL/uL (ref 3.87–5.11)
RDW: 14.2 % (ref 11.5–15.5)
WBC: 9.5 10*3/uL (ref 4.0–10.5)

## 2013-10-12 LAB — HCG, QUANTITATIVE, PREGNANCY: HCG, BETA CHAIN, QUANT, S: 2214 m[IU]/mL — AB (ref <5)

## 2013-10-12 SURGERY — LAPAROSCOPY, DIAGNOSTIC
Anesthesia: General | Site: Uterus

## 2013-10-12 MED ORDER — DEXAMETHASONE SODIUM PHOSPHATE 10 MG/ML IJ SOLN
INTRAMUSCULAR | Status: AC
  Filled 2013-10-12: qty 1

## 2013-10-12 MED ORDER — LIDOCAINE HCL (CARDIAC) 20 MG/ML IV SOLN
INTRAVENOUS | Status: AC
  Filled 2013-10-12: qty 5

## 2013-10-12 MED ORDER — FENTANYL CITRATE 0.05 MG/ML IJ SOLN: 25.0000 ug | INTRAMUSCULAR | Status: DC | PRN

## 2013-10-12 MED ORDER — TRAMADOL HCL 50 MG PO TABS: 100.0000 mg | ORAL_TABLET | Freq: Once | ORAL | Status: DC

## 2013-10-12 MED ORDER — FENTANYL CITRATE 0.05 MG/ML IJ SOLN
INTRAMUSCULAR | Status: AC
  Filled 2013-10-12: qty 5

## 2013-10-12 MED ORDER — OXYTOCIN 10 UNIT/ML IJ SOLN
40.0000 [IU] | INTRAVENOUS | Status: DC | PRN
  Administered 2013-10-12: 20 [IU] via INTRAVENOUS

## 2013-10-12 MED ORDER — NEOSTIGMINE METHYLSULFATE 10 MG/10ML IV SOLN
INTRAVENOUS | Status: DC | PRN
  Administered 2013-10-12: 4 mg via INTRAVENOUS

## 2013-10-12 MED ORDER — ONDANSETRON HCL 4 MG/2ML IJ SOLN
INTRAMUSCULAR | Status: DC | PRN
  Administered 2013-10-12: 4 mg via INTRAVENOUS

## 2013-10-12 MED ORDER — PROPOFOL 10 MG/ML IV EMUL
INTRAVENOUS | Status: AC
  Filled 2013-10-12: qty 20

## 2013-10-12 MED ORDER — GENTAMICIN SULFATE 40 MG/ML IJ SOLN
Freq: Once | INTRAVENOUS | Status: AC
  Administered 2013-10-12: 114.5 mL via INTRAVENOUS
  Filled 2013-10-12: qty 8.5

## 2013-10-12 MED ORDER — OXYCODONE HCL 5 MG/5ML PO SOLN
5.0000 mg | Freq: Once | ORAL | Status: DC | PRN
  Filled 2013-10-12: qty 5

## 2013-10-12 MED ORDER — PROPOFOL 10 MG/ML IV BOLUS
INTRAVENOUS | Status: DC | PRN
  Administered 2013-10-12: 150 mg via INTRAVENOUS

## 2013-10-12 MED ORDER — GLYCOPYRROLATE 0.2 MG/ML IJ SOLN
INTRAMUSCULAR | Status: AC
  Filled 2013-10-12: qty 1

## 2013-10-12 MED ORDER — CITRIC ACID-SODIUM CITRATE 334-500 MG/5ML PO SOLN
30.0000 mL | Freq: Once | ORAL | Status: AC
  Administered 2013-10-12: 30 mL via ORAL
  Filled 2013-10-12: qty 15

## 2013-10-12 MED ORDER — GLYCOPYRROLATE 0.2 MG/ML IJ SOLN
INTRAMUSCULAR | Status: DC | PRN
  Administered 2013-10-12: 0.6 mg via INTRAVENOUS

## 2013-10-12 MED ORDER — MIDAZOLAM HCL 2 MG/2ML IJ SOLN
INTRAMUSCULAR | Status: DC | PRN
  Administered 2013-10-12: 2 mg via INTRAVENOUS

## 2013-10-12 MED ORDER — MIDAZOLAM HCL 2 MG/2ML IJ SOLN
INTRAMUSCULAR | Status: AC
  Filled 2013-10-12: qty 2

## 2013-10-12 MED ORDER — ROCURONIUM BROMIDE 100 MG/10ML IV SOLN
INTRAVENOUS | Status: DC | PRN
  Administered 2013-10-12: 35 mg via INTRAVENOUS
  Administered 2013-10-12: 5 mg via INTRAVENOUS

## 2013-10-12 MED ORDER — ONDANSETRON HCL 4 MG/2ML IJ SOLN
INTRAMUSCULAR | Status: AC
  Filled 2013-10-12: qty 2

## 2013-10-12 MED ORDER — LIDOCAINE HCL (CARDIAC) 20 MG/ML IV SOLN
INTRAVENOUS | Status: DC | PRN
  Administered 2013-10-12: 50 mg via INTRAVENOUS

## 2013-10-12 MED ORDER — SODIUM CHLORIDE 0.9 % IJ SOLN
INTRAMUSCULAR | Status: DC | PRN
  Administered 2013-10-12: 3 mL

## 2013-10-12 MED ORDER — SODIUM CHLORIDE 0.9 % IJ SOLN
INTRAMUSCULAR | Status: AC
  Filled 2013-10-12: qty 10

## 2013-10-12 MED ORDER — BUPIVACAINE HCL (PF) 0.5 % IJ SOLN
INTRAMUSCULAR | Status: DC | PRN
  Administered 2013-10-12: 30 mL

## 2013-10-12 MED ORDER — HYDROMORPHONE HCL PF 2 MG/ML IJ SOLN
2.0000 mg | Freq: Once | INTRAMUSCULAR | Status: AC
  Administered 2013-10-12: 2 mg via INTRAVENOUS
  Filled 2013-10-12: qty 1

## 2013-10-12 MED ORDER — OXYCODONE HCL 5 MG PO TABS: 5.0000 mg | ORAL_TABLET | Freq: Once | ORAL | Status: DC | PRN

## 2013-10-12 MED ORDER — HYDROMORPHONE HCL PF 1 MG/ML IJ SOLN
1.0000 mg | Freq: Once | INTRAMUSCULAR | Status: AC
  Administered 2013-10-12: 1 mg via INTRAVENOUS
  Filled 2013-10-12: qty 1

## 2013-10-12 MED ORDER — FENTANYL CITRATE 0.05 MG/ML IJ SOLN
INTRAMUSCULAR | Status: AC
  Filled 2013-10-12: qty 2

## 2013-10-12 MED ORDER — OXYCODONE-ACETAMINOPHEN 5-325 MG PO TABS
1.0000 | ORAL_TABLET | ORAL | Status: DC | PRN
  Administered 2013-10-12: 1 via ORAL

## 2013-10-12 MED ORDER — ROCURONIUM BROMIDE 100 MG/10ML IV SOLN
INTRAVENOUS | Status: AC
  Filled 2013-10-12: qty 1

## 2013-10-12 MED ORDER — MEPERIDINE HCL 25 MG/ML IJ SOLN: 6.2500 mg | INTRAMUSCULAR | Status: DC | PRN

## 2013-10-12 MED ORDER — OXYCODONE-ACETAMINOPHEN 5-325 MG PO TABS
ORAL_TABLET | ORAL | Status: AC
  Administered 2013-10-12: 1 via ORAL
  Filled 2013-10-12: qty 1

## 2013-10-12 MED ORDER — FAMOTIDINE IN NACL 20-0.9 MG/50ML-% IV SOLN
20.0000 mg | Freq: Once | INTRAVENOUS | Status: AC
  Administered 2013-10-12: 20 mg via INTRAVENOUS
  Filled 2013-10-12: qty 50

## 2013-10-12 MED ORDER — SUCCINYLCHOLINE CHLORIDE 20 MG/ML IJ SOLN
INTRAMUSCULAR | Status: DC | PRN
  Administered 2013-10-12: 120 mg via INTRAVENOUS

## 2013-10-12 MED ORDER — LACTATED RINGERS IR SOLN
Status: DC | PRN
  Administered 2013-10-12: 1

## 2013-10-12 MED ORDER — LACTATED RINGERS IV SOLN
INTRAVENOUS | Status: DC
  Administered 2013-10-12 (×4): via INTRAVENOUS

## 2013-10-12 MED ORDER — KETOROLAC TROMETHAMINE 30 MG/ML IJ SOLN
INTRAMUSCULAR | Status: DC | PRN
  Administered 2013-10-12: 30 mg via INTRAVENOUS

## 2013-10-12 MED ORDER — METOCLOPRAMIDE HCL 5 MG/ML IJ SOLN: 10.0000 mg | Freq: Once | INTRAMUSCULAR | Status: DC | PRN

## 2013-10-12 MED ORDER — FENTANYL CITRATE 0.05 MG/ML IJ SOLN
INTRAMUSCULAR | Status: DC | PRN
  Administered 2013-10-12: 150 ug via INTRAVENOUS
  Administered 2013-10-12: 100 ug via INTRAVENOUS

## 2013-10-12 SURGICAL SUPPLY — 31 items
ADH SKN CLS APL DERMABOND .7 (GAUZE/BANDAGES/DRESSINGS) ×4
BAG SPEC RTRVL LRG 6X4 10 (ENDOMECHANICALS) ×4
CABLE HIGH FREQUENCY MONO STRZ (ELECTRODE) IMPLANT
CATH ROBINSON RED A/P 16FR (CATHETERS) IMPLANT
CLOSURE WOUND 1/4 X3 (GAUZE/BANDAGES/DRESSINGS)
CLOTH BEACON ORANGE TIMEOUT ST (SAFETY) ×6 IMPLANT
CORD ACTIVE DISPOSABLE (ELECTRODE) ×2
CORD ELECTRO ACTIVE DISP (ELECTRODE) ×1 IMPLANT
DERMABOND ADVANCED (GAUZE/BANDAGES/DRESSINGS) ×2
DERMABOND ADVANCED .7 DNX12 (GAUZE/BANDAGES/DRESSINGS) ×4 IMPLANT
DRSG COVADERM PLUS 2X2 (GAUZE/BANDAGES/DRESSINGS) ×12 IMPLANT
DRSG OPSITE POSTOP 3X4 (GAUZE/BANDAGES/DRESSINGS) IMPLANT
GLOVE BIO SURGEON STRL SZ8 (GLOVE) ×12 IMPLANT
NEEDLE INSUFFLATION 120MM (ENDOMECHANICALS) ×6 IMPLANT
NS IRRIG 1000ML POUR BTL (IV SOLUTION) ×6 IMPLANT
PACK LAPAROSCOPY BASIN (CUSTOM PROCEDURE TRAY) ×6 IMPLANT
POUCH SPECIMEN RETRIEVAL 10MM (ENDOMECHANICALS) ×3 IMPLANT
PROTECTOR NERVE ULNAR (MISCELLANEOUS) ×6 IMPLANT
SET BERKELEY SUCTION TUBING (SUCTIONS) ×3 IMPLANT
SET IRRIG TUBING LAPAROSCOPIC (IRRIGATION / IRRIGATOR) IMPLANT
STRIP CLOSURE SKIN 1/4X3 (GAUZE/BANDAGES/DRESSINGS) IMPLANT
SUT VIC AB 3-0 PS2 18 (SUTURE)
SUT VIC AB 3-0 PS2 18XBRD (SUTURE) IMPLANT
SUT VICRYL 0 UR6 27IN ABS (SUTURE) ×6 IMPLANT
SYR 30ML LL (SYRINGE) ×6 IMPLANT
TOWEL OR 17X24 6PK STRL BLUE (TOWEL DISPOSABLE) ×12 IMPLANT
TROCAR XCEL NON-BLD 11X100MML (ENDOMECHANICALS) ×6 IMPLANT
TROCAR XCEL NON-BLD 5MMX100MML (ENDOMECHANICALS) ×3 IMPLANT
VACURETTE 8 RIGID CVD (CANNULA) ×3 IMPLANT
WARMER LAPAROSCOPE (MISCELLANEOUS) ×6 IMPLANT
WATER STERILE IRR 1000ML POUR (IV SOLUTION) ×6 IMPLANT

## 2013-10-12 NOTE — Transfer of Care (Signed)
Immediate Anesthesia Transfer of Care Note  Patient: Jackie Faulkner  Procedure(s) Performed: Procedure(s) with comments: LAPAROSCOPY DIAGNOSTIC (N/A) - patient is in MAU LEFT  SALPINGECTOMY (Bilateral) SUCTION DILATATION AND CURETTAGE (N/A)  Patient Location: PACU  Anesthesia Type:General  Level of Consciousness: awake, alert  and oriented  Airway & Oxygen Therapy: Patient Spontanous Breathing and Patient connected to nasal cannula oxygen  Post-op Assessment: Report given to PACU RN and Post -op Vital signs reviewed and stable  Post vital signs: Reviewed and stable  Complications: No apparent anesthesia complications

## 2013-10-12 NOTE — Discharge Instructions (Signed)
DISCHARGE INSTRUCTIONS: Laparoscopy  MAY TAKE IBUPROFEN (MOTRIN, ADVIL) OR ALEVE AFTER 11:10 PM FOR CRAMPS!!  The following instructions have been prepared to help you care for yourself upon your return home today.  Wound care:  Do not get the incision wet for the first 24 hours. The incision should be kept clean and dry.  The Band-Aids or dressings may be removed the day after surgery.  Should the incision become sore, red, and swollen after the first week, check with your doctor.  Personal hygiene:  Shower the day after your procedure.  Activity and limitations:  Do NOT drive or operate any equipment today.  Do NOT lift anything more than 15 pounds for 2-3 weeks after surgery.  Do NOT rest in bed all day.  Walking is encouraged. Walk each day, starting slowly with 5-minute walks 3 or 4 times a day. Slowly increase the length of your walks.  Walk up and down stairs slowly.  Do NOT do strenuous activities, such as golfing, playing tennis, bowling, running, biking, weight lifting, gardening, mowing, or vacuuming for 2-4 weeks. Ask your doctor when it is okay to start.  Diet: Eat a light meal as desired this evening. You may resume your usual diet tomorrow.  Return to work: This is dependent on the type of work you do. For the most part you can return to a desk job within a week of surgery. If you are more active at work, please discuss this with your doctor.  What to expect after your surgery: You may have a slight burning sensation when you urinate on the first day. You may have a very small amount of blood in the urine. Expect to have a small amount of vaginal discharge/light bleeding for 1-2 weeks. It is not unusual to have abdominal soreness and bruising for up to 2 weeks. You may be tired and need more rest for about 1 week. You may experience shoulder pain for 24-72 hours. Lying flat in bed may relieve it.  Call your doctor for any of the following:  Develop a fever of  100.4 or greater  Inability to urinate 6 hours after discharge from hospital  Severe pain not relieved by pain medications  Persistent of heavy bleeding at incision site  Redness or swelling around incision site after a week  Increasing nausea or vomiting  Patient Signature________________________________________ Nurse Signature_________________________________________

## 2013-10-12 NOTE — MAU Note (Signed)
Pt states was in MAU last night for abdominal pain & constipation s/p MTX on 6/3. States pain never went away & has gotten worse since then. Also states vaginal bleeding has increased; saturated 2 pads since midnight with red blood & small clots.

## 2013-10-12 NOTE — Progress Notes (Signed)
33 yo S/P MTX for ectopic pregnancy with falling QHCG. Was taking percocet for pain which caused constipation and enema yesterday. Felt better after enema then began with pelvic cramps last evening which became severe. Tylenol inadequate for pain relief. No NV. Last food at 1am today.  VSS Afeb Lungs CTA Cor RRR Abd Soft, NT to deep palpation without rebound  reviewed U/S with radiologist-c/w probable bleeding into left tube, scant fluid in cul de sac  Results for orders placed during the hospital encounter of 10/12/13 (from the past 24 hour(s))  CBC     Status: Abnormal   Collection Time    10/12/13  7:00 AM      Result Value Ref Range   WBC 9.5  4.0 - 10.5 K/uL   RBC 4.57  3.87 - 5.11 MIL/uL   Hemoglobin 11.0 (*) 12.0 - 15.0 g/dL   HCT 76.8 (*) 08.8 - 11.0 %   MCV 74.8 (*) 78.0 - 100.0 fL   MCH 24.1 (*) 26.0 - 34.0 pg   MCHC 32.2  30.0 - 36.0 g/dL   RDW 31.5  94.5 - 85.9 %   Platelets 286  150 - 400 K/uL  HCG, QUANTITATIVE, PREGNANCY     Status: Abnormal   Collection Time    10/12/13  7:00 AM      Result Value Ref Range   hCG, Beta Chain, Quant, S 2214 (*) <5 mIU/mL    A: ectopic pregnancy with bleeding into fallopian tube, increasing pain with inadequate pain relief  P: D/W patient options -continued expectant management with close observation and attempts at pain management. D/W that further treatment could be needed later if HCG fails to drop further , etc.                                       -L/S-D/W surgery, possible linear salpingostomy, possible salpingectomy. D/W possible post op obstruction of tube and possible obstruction of tube after MTX. D/W risks infection, organ damage, bleeding/transfusion-HIV/Hep, DVT/PE, pneumonia. All questions answered.      Emphasized to patient she could choose either option. She chooses surgery.

## 2013-10-12 NOTE — OR Nursing (Signed)
AT END OF CASE CRNA CHECKED PUPILS AND NOTICED BILATERAL CONTACTS IN  PATIENT'S EYES ANESTHESIOLOGIST NOTIFIED AND CRNA ADVISED TO INSTILL SALINE BILATERAL EYES

## 2013-10-12 NOTE — Anesthesia Postprocedure Evaluation (Signed)
  Anesthesia Post-op Note  Patient: Jackie Faulkner  Procedure(s) Performed: Procedure(s) with comments: LAPAROSCOPY DIAGNOSTIC (N/A) - patient is in MAU LEFT  SALPINGECTOMY (Bilateral) SUCTION DILATATION AND CURETTAGE (N/A)  Patient Location: PACU  Anesthesia Type:General  Level of Consciousness: awake, alert  and oriented  Airway and Oxygen Therapy: Patient Spontanous Breathing  Post-op Pain: mild  Post-op Assessment: Post-op Vital signs reviewed, Patient's Cardiovascular Status Stable, Respiratory Function Stable, Patent Airway, No signs of Nausea or vomiting and Pain level controlled  Post-op Vital Signs: Reviewed and stable  Last Vitals:  Filed Vitals:   10/12/13 1800  BP: 102/62  Pulse: 52  Temp:   Resp: 16    Complications: No apparent anesthesia complications

## 2013-10-12 NOTE — Brief Op Note (Signed)
10/12/2013  5:20 PM  PATIENT:  Jackie Faulkner  33 y.o. female  PRE-OPERATIVE DIAGNOSIS:  left ectopic pregnancy  POST-OPERATIVE DIAGNOSIS:  left ectopic pregnancy  PROCEDURE:  Dilation and Curettage, laparoscopy with left salpingectomy  SURGEON:  Surgeon(s) and Role:    * Leslie Andrea, MD - Primary  PHYSICIAN ASSISTANT:   ASSISTANTS: none   ANESTHESIA:   general  EBL:  Total I/O In: 400 [I.V.:400] Out: 300 [Urine:200; Blood:100]  BLOOD ADMINISTERED:none  DRAINS: none   LOCAL MEDICATIONS USED:  MARCAINE     SPECIMEN:  Source of Specimen:  EMC, left salpingectomy  DISPOSITION OF SPECIMEN:  PATHOLOGY  COUNTS:  YES  TOURNIQUET:  * No tourniquets in log *  DICTATION: .Other Dictation: Dictation Number   [id number]  PLAN OF CARE: Discharge to home after PACU  PATIENT DISPOSITION:  PACU - hemodynamically stable.   Delay start of Pharmacological VTE agent (>24hrs) due to surgical blood loss or risk of bleeding: not applicable

## 2013-10-12 NOTE — Progress Notes (Signed)
It was difficult to get an emotional read on Ms Utech and her family.  I saw them prior to surgery and offered them support.  If they are admitted to the hospital, we will attempt follow-up tomorrow.  [address] [place] Pager, [phone]   10/12/13 1600  Clinical Encounter Type  Visited With Patient and family together  Visit Type Spiritual support

## 2013-10-12 NOTE — Anesthesia Procedure Notes (Signed)
Procedure Name: Intubation Date/Time: 10/12/2013 3:36 PM Performed by: Shanon Payor Pre-anesthesia Checklist: Patient identified, Emergency Drugs available, Suction available, Patient being monitored and Timeout performed Patient Re-evaluated:Patient Re-evaluated prior to inductionOxygen Delivery Method: Circle system utilized Preoxygenation: Pre-oxygenation with 100% oxygen Intubation Type: IV induction Tube type: Oral Tube size: 7.0 mm Number of attempts: 1 Airway Equipment and Method: Stylet Placement Confirmation: ETT inserted through vocal cords under direct vision,  breath sounds checked- equal and bilateral and positive ETCO2 Secured at: 22 cm Tube secured with: Tape Dental Injury: Teeth and Oropharynx as per pre-operative assessment

## 2013-10-12 NOTE — MAU Provider Note (Signed)
History   Chief Complaint:  Abdominal Pain and Vaginal Bleeding   Renuka A Benjiman CoreHinton is  33 y.o. G2P0020 Patient's last menstrual period was 08/15/2013.Marland Kitchen. Patient is here for follow up of quantitative HCG and ongoing surveillance of pregnancy status.   She is 8630w2d weeks gestation  by LMP.  Dx w/ left ectopic pregnancy at [facility]. MTX 10/05/2013. Dropping quants.   Since her last visit, the patient is with complaint on severe low abd pain and increased bleeding, now like the beginning of a period.   General ROS:  otherwise negative  Her previous Quantitative HCG values are:  Results for Ila McgillHINTON, Tattianna A (MRN [id number]) as of 10/12/2013 07:01  Ref. Range 05/18/2013 15:05 05/22/2013 14:55 10/05/2013 16:44 10/08/2013 16:59 10/11/2013 17:41  hCG, Beta Chain, Quant, S Latest Range: <5 mIU/mL 1257 (H) 1153 (H) 3519 (H) 3447 (H) 2244 (H)    Physical Exam   Blood pressure 98/48, pulse 66, temperature 97.7 F (36.5 C), temperature source Oral, resp. rate 18, last menstrual period 08/15/2013, unknown if currently breastfeeding.  Focused Gynecological Exam: Deferred until patient more comfortable. Small amount of bright red blood on pad. Labs: Results for orders placed during the hospital encounter of 10/12/13 (from the past 24 hour(s))  CBC   Collection Time    10/12/13  7:00 AM      Result Value Ref Range   WBC 9.5  4.0 - 10.5 K/uL   RBC 4.57  3.87 - 5.11 MIL/uL   Hemoglobin 11.0 (*) 12.0 - 15.0 g/dL   HCT 40.934.2 (*) 81.136.0 - 91.446.0 %   MCV 74.8 (*) 78.0 - 100.0 fL   MCH 24.1 (*) 26.0 - 34.0 pg   MCHC 32.2  30.0 - 36.0 g/dL   RDW 78.214.2  95.611.5 - 21.315.5 %   Platelets 286  150 - 400 K/uL  Results for orders placed during the hospital encounter of 10/11/13 (from the past 24 hour(s))  HCG, QUANTITATIVE, PREGNANCY   Collection Time    10/11/13  5:41 PM      Result Value Ref Range   hCG, Beta Chain, Quant, S 2244 (*) <5 mIU/mL    Ultrasound Studies:   [place] Ob Comp Less 14 Wks  10/06/2013   CLINICAL  DATA:  History of ectopic pregnancy treated with methotrexate. Pain.  EXAM: OBSTETRIC <14 WK US AND TRANSVAGINAL OB US  TECHNIQUE: Both transabdominal and transvaginal ultrasound examinations were performed for complete evaluation of the gestation as well as the maternal uterus, adnexal regions, and pelvic cul-de-sac. Transvaginal technique was performed to assess early pregnancy.  COMPARISON:  05/22/2013  FINDINGS: Intrauterine gestational sac: Small cystic structure in the right upper uterine segment, which may reflect gestational sac or pseudo gestational sac.  Yolk sac:  Not visualize  Embryo:  No  Questionable gestational sac:  7.3  mm   5 w   2  d  Maternal uterus/adnexae: No uterine/endometrial hemorrhage. No uterine mass. Cervix is closed.  Normal right ovary.  Left ovary is somewhat heterogeneous and prominent measuring 4.1 cm x 2.2 cm by 3.1 cm.  Small amount pelvic free fluid.  IMPRESSION: 1. No convincing ectopic pregnancy. 2. Small cystic structure in the right upper uterine segment which could reflect a gestational sac or pseudo sac. It is mean diameter 7.3 mm which would suggest a 5 week, 2 day gestation. No embryo or yolk sac seen. 3. Small amount pelvic free fluid. 4. No other abnormalities.   Electronically Signed   By:  Amie Portland M.D.   On: 10/06/2013 22:21   US Ob Transvaginal  10/12/2013   CLINICAL DATA:  Known ectopic pregnancy recent treatment with methotrexate now with heavy vaginal bleeding and abdominal pain  EXAM: TRANSVAGINAL OB ULTRASOUND  TECHNIQUE: Transvaginal ultrasound was performed for complete evaluation of the gestation as well as the maternal uterus, adnexal regions, and pelvic cul-de-sac.  COMPARISON:  October 06, 2013  FINDINGS: Intrauterine gestational sac: None visualized  Yolk sac:  None  Embryo:  None  Cardiac Activity: None  Maternal uterus/adnexae: The endometrium is thickened with a considerable amount of endometrial cavity blood present. The ovaries are normal  bilaterally. In the left adnexal region superior to the ovary there is a complex mass demonstrating increased vascularity measuring 3.3 x 2.2 x 3.6 cm. There is no more than a tiny amount of free pelvic fluid.  IMPRESSION: 1. A complex mass in the left adnexal region is suspicious for an ectopic pregnancy. No fetal cardiac activity is demonstrated. No significantf free pelvic fluid is present. 2. There is a large amount of blood as well as a thickened endometrium within the uterus. No discrete IUP is demonstrated. 3. These results were called by telephone at the time of interpretation on 10/12/2013 at 8:03 AM to Dorathy Kinsman , Nurse Midwife,who verbally acknowledged these results.   Electronically Signed   By: David  [place]   On: 10/12/2013 08:06   US Ob Transvaginal  10/06/2013   CLINICAL DATA:  History of ectopic pregnancy treated with methotrexate. Pain.  EXAM: OBSTETRIC <14 WK [place] AND TRANSVAGINAL OB US  TECHNIQUE: Both transabdominal and transvaginal ultrasound examinations were performed for complete evaluation of the gestation as well as the maternal uterus, adnexal regions, and pelvic cul-de-sac. Transvaginal technique was performed to assess early pregnancy.  COMPARISON:  05/22/2013  FINDINGS: Intrauterine gestational sac: Small cystic structure in the right upper uterine segment, which may reflect gestational sac or pseudo gestational sac.  Yolk sac:  Not visualize  Embryo:  No  Questionable gestational sac:  7.3  mm   5 w   2  d  Maternal uterus/adnexae: No uterine/endometrial hemorrhage. No uterine mass. Cervix is closed.  Normal right ovary.  Left ovary is somewhat heterogeneous and prominent measuring 4.1 cm x 2.2 cm by 3.1 cm.  Small amount pelvic free fluid.  IMPRESSION: 1. No convincing ectopic pregnancy. 2. Small cystic structure in the right upper uterine segment which could reflect a gestational sac or pseudo sac. It is mean diameter 7.3 mm which would suggest a 5 week, 2 day gestation. No  embryo or yolk sac seen. 3. Small amount pelvic free fluid. 4. No other abnormalities.   Electronically Signed   By: Amie Portland M.D.   On: 10/06/2013 22:21    CLINICAL DATA: Known ectopic pregnancy recent treatment with  methotrexate now with heavy vaginal bleeding and abdominal pain  EXAM:  TRANSVAGINAL OB ULTRASOUND  TECHNIQUE:  Transvaginal ultrasound was performed for complete evaluation of the  gestation as well as the maternal uterus, adnexal regions, and  pelvic cul-de-sac.  COMPARISON: October 06, 2013  FINDINGS:  Intrauterine gestational sac: None visualized  Yolk sac: None  Embryo: None  Cardiac Activity: None  Maternal uterus/adnexae: The endometrium is thickened with a  considerable amount of endometrial cavity blood present. The ovaries  are normal bilaterally. In the left adnexal region superior to the  ovary there is a complex mass demonstrating increased vascularity  measuring 3.3 x 2.2 x 3.6 cm.  There is no more than a tiny amount of  free pelvic fluid.  IMPRESSION:  1. A complex mass in the left adnexal region is suspicious for an  ectopic pregnancy. No fetal cardiac activity is demonstrated. No  significantf free pelvic fluid is present.  2. There is a large amount of blood as well as a thickened  endometrium within the uterus. No discrete IUP is demonstrated.  3. These results were called by telephone at the time of  interpretation on 10/12/2013 at 8:03 AM to Dorathy Kinsman , Nurse  Midwife,who verbally acknowledged these results.   Assessment: Left ectopic pregnancy Large amount of blood in the uterus and thickened endometrium  Care of patient turned over to Joseph Berkshire, PA at 8:10 AM. Will call Dr. Henderson Cloud with results.  Dorathy Kinsman 10/12/2013, 7:36 AM   MDM Dr. Henderson Cloud called with [place] results. He will review images and come to MAU to see the patient.   A: Left ectopic pregnancy s/p MTX  P: Dr. Henderson Cloud to MAU to evaluate and manage the  patient  Freddi Starr, PA-C 10/12/2013 10:12 AM

## 2013-10-12 NOTE — Anesthesia Preprocedure Evaluation (Signed)
Anesthesia Evaluation  Patient identified by MRN, date of birth, ID band Patient awake    Reviewed: Allergy & Precautions, H&P , NPO status , Patient's Chart, lab work & pertinent test results  Airway Mallampati: II TM Distance: >3 FB Neck ROM: Full    Dental no notable dental hx. (+) Teeth Intact   Pulmonary former smoker,  breath sounds clear to auscultation  Pulmonary exam normal       Cardiovascular negative cardio ROS  Rhythm:Regular Rate:Normal     Neuro/Psych Anxiety negative neurological ROS     GI/Hepatic Neg liver ROS, GERD-  Medicated and Controlled,  Endo/Other  Obesity  Renal/GU negative Renal ROS  negative genitourinary   Musculoskeletal negative musculoskeletal ROS (+)   Abdominal (+) + obese,   Peds  Hematology negative hematology ROS (+)   Anesthesia Other Findings   Reproductive/Obstetrics (+) Pregnancy Ectopic pregnancy                           Anesthesia Physical Anesthesia Plan  ASA: II and emergent  Anesthesia Plan: General   Post-op Pain Management:    Induction: Intravenous, Rapid sequence and Cricoid pressure planned  Airway Management Planned: Oral ETT  Additional Equipment:   Intra-op Plan:   Post-operative Plan: Extubation in OR  Informed Consent: I have reviewed the patients History and Physical, chart, labs and discussed the procedure including the risks, benefits and alternatives for the proposed anesthesia with the patient or authorized representative who has indicated his/her understanding and acceptance.   Dental advisory given  Plan Discussed with: CRNA, Anesthesiologist and Surgeon  Anesthesia Plan Comments:         Anesthesia Quick Evaluation

## 2013-10-12 NOTE — Progress Notes (Signed)
Reviewed findings with patient and her husband. D/W left adnexal mass C/W ectopic in tube but no significant blood in cul de sac.  D/W fall in HCG to date. Reviewed difficulty with adequate pain control for patient and increased pain last pm and today. D/W options including continued careful observation with possible further bleeding and need to continue to follow QHCG vs L/S and serial QHCG. D/W L/S with linear salpingostomy and possible salpingectomy. D/W possible tubal occlusion with or without surgery and possible ectopic on right side in future. Emphasized choice of management-patient and husband choose surgery. D/W risks of surgery including infection, organ damage, bleeding/transfusion-HIV/Hep, DVT/PE, pneumonia, post op pain and dyspareunia. All questions answered.

## 2013-10-13 ENCOUNTER — Encounter (HOSPITAL_COMMUNITY): Payer: Self-pay | Admitting: Obstetrics and Gynecology

## 2013-10-13 NOTE — Op Note (Signed)
NAME:  Jackie Faulkner, Jackie Faulkner                    ACCOUNT NO.:  MEDICAL RECORD NO.:  [id number]  LOCATION:                                 FACILITY:  PHYSICIAN:  Guy Sandifer. Henderson Cloud, M.D. DATE OF BIRTH:  05/14/1980  DATE OF PROCEDURE:  10/12/2013 DATE OF DISCHARGE:                              OPERATIVE REPORT   PREOPERATIVE DIAGNOSIS:  Left adnexal mass, probable ectopic.  POSTOPERATIVE DIAGNOSIS:  Left ectopic pregnancy.  PROCEDURE:  Laparoscopy with left salpingectomy, dilation and curettage.  SURGEON:  Guy Sandifer. Henderson Cloud, M.D.  ANESTHESIA:  General with endotracheal intubation.  Angelica Pou, MD.  ESTIMATED BLOOD LOSS:  100 mL.  SPECIMENS: 1. Endometrial curettings. 2. Left fallopian tube to pathology.  INDICATIONS AND CONSENT:  This patient is a 33 year old, G2, P0, who received methotrexate approximately 1 week ago.  HCGs have been falling appropriately.  She has had a recurring problem with pain management as detailed in history and physical.  She had increasing pain last night and this morning.  An ultrasound shows a new development of a 3.5 cm left adnexal mass, felt to be within the fallopian tube, with scant fluid in the cul-de-sac.  Options of management were discussed with the patient and her husband.  She requests surgery.  Laparoscopy with possible left linear salpingostomy, possible salpingectomy has been discussed.  Potential risks and complications had been reviewed preoperatively including but not limited to, infection, organ damage, bleeding requiring transfusion of blood products with HIV and hepatitis acquisition, DVT, PE, pneumonia, pelvic pain, painful intercourse, return to the operating room, recurrent ectopic pregnancy, obstruction of the left fallopian tube.  All questions were answered and consent was signed on the chart.  FINDINGS:  Upon speculum exam, there is tissue at the cervical os. Laparoscopically, the large bowel was adherent to the right  lower anterior abdominal wall.  In the pelvis, the uterus is about 8 weeks in size.  Anterior cul-de-sac is normal.  Posterior cul-de-sac contains multiple omental adhesions.  The right ovary is behind several filmy adhesions on the right pelvic sidewall.  The right fallopian tube was wrapped around the ovary.  The distal portion is somewhat swollen and indurated and the fimbriae are blunted.  The left ovary is normal.  The left fallopian tube in the distal 1/2 of the tube was dilated to a 3-4 cm, with clot and tissue.  There is scant blood in the cul-de-sac.  PROCEDURE:  The patient was taken into the operating room, where she was identified, placed in dorsal supine position, and general anesthesia was induced via endotracheal intubation.  She was then placed in dorsal lithotomy position.  Time-out undertaken.  She was prepped abdominally and vaginally.  Bladder straight catheterized.  The patient was then draped in a sterile fashion.  Speculum was placed in the above-described tissue and the cervix was noted.  This was removed easily with ring clamps.  The anterior lip of the cervix was then grasped with a ring clamp.  This allows very gentle sharp curettage and suction curettage for a small amount of tissue with a #8 curved curette.  Excellent hemostasis was obtained.  20 units of  Pitocin were added to the 1 L of IV fluids hanging at that point.  A Hulka tenaculum was placed.  The ring clamp was removed.  Attention was turned to the abdomen.  The infraumbilical and suprapubic areas were injected with 0.5% plain Marcaine.  Small infraumbilical incision was made.  Disposable Veress needle was placed on the 1st attempt, and a good syringe and drop test were noted.  2 L of gas were then insufflated under low pressure with good tympany in the right upper quadrant.  Veress needle was removed and 10/11 XL bladeless disposable trocar sleeve was placed using direct visualization with the  diagnostic laparoscope.  The operative scope was then used.  A small suprapubic incision was made and a 5-mm disposable trocar sleeve was placed under direct visualization without difficulty. This was done well clear of the area of adhesions and the bladder. Later, a left lower quadrant incision was made after careful transillumination and a 2nd 5-mm trocar sleeve was placed again under direct visualization without difficulty.  After noting the above findings, the right ovary and fallopian tube were freed with blunt dissection.  The left fallopian tube was elevated and an unipolar needle cautery was used to cauterize and open the antimesenteric portion of the fallopian tube.  Attempts at freeing the ectopic pregnancy tissue with irrigation as well as blunt dissection unsuccessful and created bleeding.  Therefore, decision was made to proceed with salpingectomy. Using the EnSeal, bipolar cautery, cutting instrument, the tube was taken down, roughly the midpoint comes across the mesosalpinx and across the distal fallopian tube under good visualization.  This was placed in the anterior cul-de-sac.  Then, using the 5-mm laparoscope through the suprapubic incision, an EndoCatch was used to the umbilical incision, easily retrieving the tissue.  The 10/11 trocar sleeve was replaced under direct visualization using the 5 mm scope.  Switching back to the operative scope, copious irrigation was carried out and good hemostasis was noted all around.  The remaining approximately 25 mL of 0.5% plain Marcaine was placed into the abdominal cavity.  Suprapubic trocar sleeves were removed.  Pneumoperitoneum was reduced and the umbilical trocar sleeve was removed.  The umbilical incision was closed with 0 Vicryl in the subcutaneous tissues under good visualization.  Skin incisions were closed with interrupted 2-0 Vicryl.  Dermabond was placed on the incisions.  Hulka tenaculum was removed and good hemostasis  was noted.  Bladder was again drained.  The catheter was removed.  All counts were correct.  The patient is awake and taken to recovery room in stable condition.     Guy SandiferJames E. Henderson Cloudomblin, M.D.     JET/MEDQ  D:  10/12/2013  T:  10/13/2013  Job:  [id number]

## 2014-01-12 ENCOUNTER — Other Ambulatory Visit (HOSPITAL_COMMUNITY): Payer: Self-pay | Admitting: Obstetrics & Gynecology

## 2014-01-12 DIAGNOSIS — Z8742 Personal history of other diseases of the female genital tract: Secondary | ICD-10-CM

## 2014-01-18 ENCOUNTER — Ambulatory Visit (HOSPITAL_COMMUNITY)
Admission: RE | Admit: 2014-01-18 | Discharge: 2014-01-18 | Disposition: A | Discharge status: Home / Self Care | Payer: BC Managed Care – PPO | Source: Ambulatory Visit | Attending: Obstetrics & Gynecology | Admitting: Obstetrics & Gynecology

## 2014-01-18 DIAGNOSIS — N949 Unspecified condition associated with female genital organs and menstrual cycle: Secondary | ICD-10-CM | POA: Insufficient documentation

## 2014-01-18 DIAGNOSIS — Z8742 Personal history of other diseases of the female genital tract: Secondary | ICD-10-CM

## 2014-01-18 MED ORDER — IOHEXOL 300 MG/ML  SOLN
20.0000 mL | Freq: Once | INTRAMUSCULAR | Status: AC | PRN
  Administered 2014-01-18: 20 mL

## 2014-02-14 ENCOUNTER — Other Ambulatory Visit: Payer: Self-pay | Admitting: Gastroenterology

## 2014-02-14 DIAGNOSIS — R110 Nausea: Secondary | ICD-10-CM

## 2014-03-06 ENCOUNTER — Encounter (HOSPITAL_COMMUNITY): Payer: Self-pay | Admitting: Obstetrics and Gynecology

## 2014-03-08 ENCOUNTER — Ambulatory Visit (HOSPITAL_COMMUNITY)
Admission: RE | Admit: 2014-03-08 | Discharge: 2014-03-08 | Disposition: A | Discharge status: Home / Self Care | Payer: BC Managed Care – PPO | Source: Ambulatory Visit | Attending: Gastroenterology | Admitting: Gastroenterology

## 2014-03-08 DIAGNOSIS — R110 Nausea: Secondary | ICD-10-CM | POA: Diagnosis present

## 2014-03-08 DIAGNOSIS — K838 Other specified diseases of biliary tract: Secondary | ICD-10-CM | POA: Insufficient documentation

## 2014-03-08 DIAGNOSIS — K8020 Calculus of gallbladder without cholecystitis without obstruction: Secondary | ICD-10-CM | POA: Diagnosis not present

## 2014-03-08 DIAGNOSIS — R1011 Right upper quadrant pain: Secondary | ICD-10-CM | POA: Diagnosis not present

## 2014-03-08 MED ORDER — TECHNETIUM TC 99M MEBROFENIN IV KIT
5.0000 | PACK | Freq: Once | INTRAVENOUS | Status: AC | PRN
Start: 2014-03-08 — End: 2014-03-08
  Administered 2014-03-08: 5 via INTRAVENOUS

## 2014-03-08 MED ORDER — STERILE WATER FOR INJECTION IJ SOLN
INTRAMUSCULAR | Status: AC
  Administered 2014-03-08: 10 mL
  Filled 2014-03-08: qty 10

## 2014-03-08 MED ORDER — SINCALIDE 5 MCG IJ SOLR
INTRAMUSCULAR | Status: AC
Start: 2014-03-08 — End: 2014-03-08
  Filled 2014-03-08: qty 5

## 2014-03-08 MED ORDER — SINCALIDE 5 MCG IJ SOLR
0.0200 ug/kg | Freq: Once | INTRAMUSCULAR | Status: AC
Start: 2014-03-08 — End: 2014-03-08
  Administered 2014-03-08: 5 ug via INTRAVENOUS

## 2014-03-21 ENCOUNTER — Inpatient Hospital Stay (HOSPITAL_COMMUNITY)
Admission: AD | Admit: 2014-03-21 | Discharge: 2014-03-21 | Disposition: A | Discharge status: Home / Self Care | Payer: BC Managed Care – PPO | Source: Ambulatory Visit | Attending: Obstetrics and Gynecology | Admitting: Obstetrics and Gynecology

## 2014-03-21 DIAGNOSIS — N898 Other specified noninflammatory disorders of vagina: Secondary | ICD-10-CM | POA: Insufficient documentation

## 2014-03-21 LAB — URINALYSIS, ROUTINE W REFLEX MICROSCOPIC
Bilirubin Urine: NEGATIVE
GLUCOSE, UA: NEGATIVE mg/dL
HGB URINE DIPSTICK: NEGATIVE
Ketones, ur: 15 mg/dL — AB
Leukocytes, UA: NEGATIVE
Nitrite: NEGATIVE
Protein, ur: NEGATIVE mg/dL
Urobilinogen, UA: 0.2 mg/dL (ref 0.0–1.0)
pH: 5.5 (ref 5.0–8.0)

## 2014-03-21 LAB — POCT PREGNANCY, URINE: PREG TEST UR: NEGATIVE

## 2014-03-21 NOTE — MAU Note (Addendum)
Vag discharge; external itching. Going on about a wk, had changes type of soap.  Was having some back pain on Sun

## 2014-04-04 ENCOUNTER — Other Ambulatory Visit (INDEPENDENT_AMBULATORY_CARE_PROVIDER_SITE_OTHER): Payer: Self-pay | Admitting: Surgery

## 2014-05-15 ENCOUNTER — Encounter (HOSPITAL_COMMUNITY): Payer: Self-pay | Admitting: *Deleted

## 2014-05-15 ENCOUNTER — Inpatient Hospital Stay (HOSPITAL_COMMUNITY): Payer: BLUE CROSS/BLUE SHIELD

## 2014-05-15 ENCOUNTER — Inpatient Hospital Stay (HOSPITAL_COMMUNITY)
Admission: AD | Admit: 2014-05-15 | Discharge: 2014-05-15 | Disposition: A | Discharge status: Home / Self Care | Payer: BLUE CROSS/BLUE SHIELD | Source: Ambulatory Visit | Attending: Obstetrics and Gynecology | Admitting: Obstetrics and Gynecology

## 2014-05-15 DIAGNOSIS — N390 Urinary tract infection, site not specified: Secondary | ICD-10-CM

## 2014-05-15 DIAGNOSIS — O200 Threatened abortion: Secondary | ICD-10-CM | POA: Diagnosis not present

## 2014-05-15 DIAGNOSIS — Z3A10 10 weeks gestation of pregnancy: Secondary | ICD-10-CM

## 2014-05-15 DIAGNOSIS — O2341 Unspecified infection of urinary tract in pregnancy, first trimester: Secondary | ICD-10-CM | POA: Diagnosis not present

## 2014-05-15 LAB — URINE MICROSCOPIC-ADD ON

## 2014-05-15 LAB — URINALYSIS, ROUTINE W REFLEX MICROSCOPIC
Bilirubin Urine: NEGATIVE
GLUCOSE, UA: NEGATIVE mg/dL
KETONES UR: 15 mg/dL — AB
Leukocytes, UA: NEGATIVE
NITRITE: NEGATIVE
Protein, ur: NEGATIVE mg/dL
Urobilinogen, UA: 0.2 mg/dL (ref 0.0–1.0)
pH: 6 (ref 5.0–8.0)

## 2014-05-15 LAB — HCG, QUANTITATIVE, PREGNANCY: hCG, Beta Chain, Quant, S: 10243 m[IU]/mL — ABNORMAL HIGH (ref <5)

## 2014-05-15 MED ORDER — NITROFURANTOIN MONOHYD MACRO 100 MG PO CAPS: 100.0000 mg | ORAL_CAPSULE | Freq: Two times a day (BID) | ORAL | Status: DC

## 2014-05-15 NOTE — MAU Note (Addendum)
[facility] on self, no bleeding, Dr. Henderson Cloudomblin dx failed pregnancy, going for 2nd opinion, not sure what discharge was today Dr. Henderson Cloudomblin told her she would miscarry this preg Dr Henderson Cloudomblin on unit and called office last US  Sac without embro, will repeat US tonight

## 2014-05-15 NOTE — MAU Provider Note (Signed)
Subjective:  Jackie Faulkner is a 34 y.o. female G3P0020 at 3773w3d who presents for an US. She was told in December that the pregnancy was an anembryonic pregnancy. The patient wanted a second opinion.  She currently denies pain, denies vaginal bleeding. She does attest to: Urine frequency Denies dysuria  Denies fever Denies back pain   RN:   Expand All Collapse All   Went to [facility] and felt like she urinated on self, no bleeding, Dr. Henderson Cloudomblin dx failed pregnancy, going for 2nd opinion, not sure what discharge was today Dr. Henderson Cloudomblin told her she would miscarry this preg Dr Henderson Cloudomblin on unit and called office last US Sac without embro, will repeat US tonight         Objective:  GENERAL: Well-developed, well-nourished female in no acute distress.  HEENT: Normocephalic, atraumatic.   LUNGS: Effort normal SKIN: Warm, dry and without erythema PSYCH: Normal mood and affect  Filed Vitals:   05/15/14 1721  BP: 129/81  Pulse: 68  Temp: 98.6 F (37 C)  Resp: 18   Results for orders placed or performed during the hospital encounter of 05/15/14 (from the past 48 hour(s))  Urinalysis, Routine w reflex microscopic     Status: Abnormal   Collection Time: 05/15/14  5:30 PM  Result Value Ref Range   Color, Urine YELLOW YELLOW   APPearance CLEAR CLEAR   Specific Gravity, Urine >1.030 (H) 1.005 - 1.030   pH 6.0 5.0 - 8.0   Glucose, UA NEGATIVE NEGATIVE mg/dL   Hgb urine dipstick LARGE (A) NEGATIVE   Bilirubin Urine NEGATIVE NEGATIVE   Ketones, ur 15 (A) NEGATIVE mg/dL   Protein, ur NEGATIVE NEGATIVE mg/dL   Urobilinogen, UA 0.2 0.0 - 1.0 mg/dL   Nitrite NEGATIVE NEGATIVE   Leukocytes, UA NEGATIVE NEGATIVE  Urine microscopic-add on     Status: Abnormal   Collection Time: 05/15/14  5:30 PM  Result Value Ref Range   Squamous Epithelial / LPF FEW (A) RARE   WBC, UA 3-6 <3 WBC/hpf   RBC / HPF 21-50 <3 RBC/hpf   Bacteria, UA MANY (A) RARE   Urine-Other MUCOUS PRESENT    [place] Ob Comp  Less 14 Wks  05/15/2014   CLINICAL DATA:  Pregnant, threatened abortion ; office ultrasound show intrauterine gestational sac in yolk sac but no embryo 04/24/2014 and 05/03/2014  EXAM: OBSTETRIC <14 WK US AND TRANSVAGINAL OB US  TECHNIQUE: Both transabdominal and transvaginal ultrasound examinations were performed for complete evaluation of the gestation as well as the maternal uterus, adnexal regions, and pelvic cul-de-sac. Transvaginal technique was performed to assess early pregnancy.  COMPARISON:  None available for this gestational ; reportedly had in-office ultrasounds performed  FINDINGS: Intrauterine gestational sac: Visualized/normal in shape.  Yolk sac:  Present  Embryo:  Present  Cardiac Activity: Not identified  Heart Rate:  N/A bpm  MSD:  18.1  mm   6 w   5  d  CRL:   2.4  mm   5 w 6 d  Maternal uterus/adnexae: No subchorionic hemorrhage.  LEFT ovary normal size and morphology, 1.9 x 2.5 x 1.4 cm.  RIGHT ovary normal size and morphology, 3.9 x 3.8 x 2.1 cm, with a small corpus luteal cyst.  No free pelvic fluid or adnexal masses otherwise identified.  IMPRESSION: Gestational sac seen within the uterus containing a fetal pole but without detectable fetal cardiac activity.  Findings are suspicious but not yet definitive for failed pregnancy. Recommend follow-up [place]  in 10-14 days for definitive diagnosis. This recommendation follows SRU consensus guidelines: Diagnostic Criteria for Nonviable Pregnancy Early in the First Trimester. Malva Limes Med 2013; 161:0960-45.   Electronically Signed   By: Ulyses Southward M.D.   On: 05/15/2014 19:03   US Ob Transvaginal  05/15/2014   CLINICAL DATA:  Pregnant, threatened abortion ; office ultrasound show intrauterine gestational sac in yolk sac but no embryo 04/24/2014 and 05/03/2014  EXAM: OBSTETRIC <14 WK [place] AND TRANSVAGINAL OB US  TECHNIQUE: Both transabdominal and transvaginal ultrasound examinations were performed for complete evaluation of the gestation as well as  the maternal uterus, adnexal regions, and pelvic cul-de-sac. Transvaginal technique was performed to assess early pregnancy.  COMPARISON:  None available for this gestational ; reportedly had in-office ultrasounds performed  FINDINGS: Intrauterine gestational sac: Visualized/normal in shape.  Yolk sac:  Present  Embryo:  Present  Cardiac Activity: Not identified  Heart Rate:  N/A bpm  MSD:  18.1  mm   6 w   5  d  CRL:   2.4  mm   5 w 6 d  Maternal uterus/adnexae: No subchorionic hemorrhage.  LEFT ovary normal size and morphology, 1.9 x 2.5 x 1.4 cm.  RIGHT ovary normal size and morphology, 3.9 x 3.8 x 2.1 cm, with a small corpus luteal cyst.  No free pelvic fluid or adnexal masses otherwise identified.  IMPRESSION: Gestational sac seen within the uterus containing a fetal pole but without detectable fetal cardiac activity.  Findings are suspicious but not yet definitive for failed pregnancy. Recommend follow-up US in 10-14 days for definitive diagnosis. This recommendation follows SRU consensus guidelines: Diagnostic Criteria for Nonviable Pregnancy Early in the First Trimester. Malva Limes Med 2013; 409:8119-14.   Electronically Signed   By: Ulyses Southward M.D.   On: 05/15/2014 19:03     MDM: Discussed US findings with Dr. Jasmine December hcg today in MAU Will treat for UTI Urine culture pending  Discussed US findings with patient and significant other. Questions answered.    Assessment:  -IUP with fetal pole, no cardiac activity -Suspicious for failed pregnancy -UTI   Plan:  Discharge home in stable condition Return in 48 hours for repeat beta hcg Urine culture pending RX: Macrobid Return to [facility] if symptoms worsen Support given  Iona Hansen Aziya Arena, NP 05/15/2014 7:49 PM

## 2014-05-15 NOTE — MAU Note (Signed)
Pt. Urine in lab 

## 2014-05-16 LAB — CULTURE, OB URINE
Colony Count: NO GROWTH
Culture: NO GROWTH
Special Requests: NORMAL

## 2014-05-17 ENCOUNTER — Inpatient Hospital Stay (HOSPITAL_COMMUNITY)
Admission: AD | Admit: 2014-05-17 | Discharge: 2014-05-17 | Disposition: A | Discharge status: Home / Self Care | Payer: BLUE CROSS/BLUE SHIELD | Source: Ambulatory Visit | Attending: Obstetrics and Gynecology | Admitting: Obstetrics and Gynecology

## 2014-05-17 DIAGNOSIS — O021 Missed abortion: Secondary | ICD-10-CM | POA: Insufficient documentation

## 2014-05-17 DIAGNOSIS — O039 Complete or unspecified spontaneous abortion without complication: Secondary | ICD-10-CM | POA: Diagnosis not present

## 2014-05-17 LAB — HCG, QUANTITATIVE, PREGNANCY: HCG, BETA CHAIN, QUANT, S: 6976 m[IU]/mL — AB (ref <5)

## 2014-05-17 NOTE — MAU Note (Signed)
Pt reports she started spotting this am, not having any pain.

## 2014-05-17 NOTE — Discharge Instructions (Signed)
Incomplete Miscarriage A miscarriage is the sudden loss of an unborn baby (fetus) before the 20th week of pregnancy. In an incomplete miscarriage, parts of the fetus or placenta (afterbirth) remain in the body.  Having a miscarriage can be an emotional experience. Talk with your health care provider about any questions you may have about miscarrying, the grieving process, and your future pregnancy plans. CAUSES   Problems with the fetal chromosomes that make it impossible for the baby to develop normally. Problems with the baby's genes or chromosomes are most often the result of errors that occur by chance as the embryo divides and grows. The problems are not inherited from the parents.  Infection of the cervix or uterus.  Hormone problems.  Problems with the cervix, such as having an incompetent cervix. This is when the tissue in the cervix is not strong enough to hold the pregnancy.  Problems with the uterus, such as an abnormally shaped uterus, uterine fibroids, or congenital abnormalities.  Certain medical conditions.  Smoking, drinking alcohol, or taking illegal drugs.  Trauma. SYMPTOMS   Vaginal bleeding or spotting, with or without cramps or pain.  Pain or cramping in the abdomen or lower back.  Passing fluid, tissue, or blood clots from the vagina. DIAGNOSIS  Your health care provider will perform a physical exam. You may also have an ultrasound to confirm the miscarriage. Blood or urine tests may also be ordered. TREATMENT   Usually, a dilation and curettage (D&C) procedure is performed. During a D&C procedure, the cervix is widened (dilated) and any remaining fetal or placental tissue is gently removed from the uterus.  Antibiotic medicines are prescribed if there is an infection. Other medicines may be given to reduce the size of the uterus (contract) if there is a lot of bleeding.  If you have Rh negative blood and your baby was Rh positive, you will need a Rho (D)  immune globulin shot. This shot will protect any future baby from having Rh blood problems in future pregnancies.  You may be confined to bed rest. This means you should stay in bed and only get up to use the bathroom. HOME CARE INSTRUCTIONS   Rest as directed by your health care provider.  Restrict activity as directed by your health care provider. You may be allowed to continue light activity if curettage was not done but you require further treatment.  Keep track of the number of pads you use each day. Keep track of how soaked (saturated) they are. Record this information.  Do not  use tampons.  Do not douche or have sexual intercourse until approved by your health care provider.  Keep all follow-up appointments for reevaluation and continuing management.  Only take over-the-counter or prescription medicines for pain, fever, or discomfort as directed by your health care provider.  Take antibiotic medicine as directed by your health care provider. Make sure you finish it even if you start to feel better. SEEK IMMEDIATE MEDICAL CARE IF:   You experience severe cramps in your stomach, back, or abdomen.  You have an unexplained temperature (make sure to record these temperatures).  You pass large clots or tissue (save these for your health care provider to inspect).  Your bleeding increases.  You become light-headed, weak, or have fainting episodes. MAKE SURE YOU:   Understand these instructions.  Will watch your condition.  Will get help right away if you are not doing well or get worse. Document Released: 04/21/2005 Document Revised: 09/05/2013 Document Reviewed:   11/18/2012 ExitCare Patient Information 2015 [employer]. This information is not intended to replace advice given to you by your health care provider. Make sure you discuss any questions you have with your health care provider.  

## 2014-05-17 NOTE — MAU Provider Note (Signed)
Ms. Mammie LorenzoSummer A Benjiman CoreHinton  is a 34 y.o. G3P0020 at 4750w6d who presents to MAU today for follow-up quant hCG after 48 hours. She states light spotting started earlier today. She denies heavy vaginal bleeding or abdominal pain. The patient was originally told by Dr. Henderson Cloudomblin that she had a blighted ovum and would miscarry and presented to MAU on 05/15/14 for a second opinion.   BP 126/80 mmHg  Pulse 68  Temp(Src) 98.2 F (36.8 C) (Oral)  Resp 18  SpO2 100%  LMP 03/08/2014  GENERAL: Well-developed, well-nourished female in no acute distress.  HEENT: Normocephalic, atraumatic.   LUNGS: Effort normal HEART: Regular rate  SKIN: Warm, dry and without erythema PSYCH: Normal mood and affect  Results for Ila McgillHINTON, Alysen A (MRN [id number]) as of 05/18/2014 02:06  Ref. Range 05/15/2014 19:43 05/17/2014 22:21  hCG, Beta Chain, Quant, S Latest Range: <5 mIU/mL 10243 (H) 6976 (H)   MDM Discussed patient with Dr. Marcelle OverlieHolland as patient was currently under care of [facility]. He suggests follow-up in the office for management.  Discussed with patient plan to follow-up in the office. She states that she will no longer return to Dr. Huel Coventryomblin's office based on her perceived mismanagement of this pregnancy.  Discussed expectant management vs cytotec vs D&C with patient. Patient chooses expectant management at this time. Would like to follow-up with [facility].   A: Missed AB  P: Discharge home Bleeding precautions discussed [facility] Patient may return to MAU as needed or if her condition were to change or worsen   Marny LowensteinJulie N Wenzel, PA-C 05/18/2014 2:02 AM

## 2014-05-18 ENCOUNTER — Encounter: Payer: Self-pay | Admitting: Obstetrics & Gynecology

## 2014-05-20 ENCOUNTER — Encounter (HOSPITAL_COMMUNITY): Payer: Self-pay | Admitting: *Deleted

## 2014-05-20 ENCOUNTER — Inpatient Hospital Stay (HOSPITAL_COMMUNITY)
Admission: AD | Admit: 2014-05-20 | Discharge: 2014-05-20 | Disposition: A | Discharge status: Home / Self Care | Payer: BLUE CROSS/BLUE SHIELD | Source: Ambulatory Visit | Attending: Obstetrics & Gynecology | Admitting: Obstetrics & Gynecology

## 2014-05-20 DIAGNOSIS — O034 Incomplete spontaneous abortion without complication: Secondary | ICD-10-CM | POA: Insufficient documentation

## 2014-05-20 DIAGNOSIS — Z87891 Personal history of nicotine dependence: Secondary | ICD-10-CM | POA: Insufficient documentation

## 2014-05-20 DIAGNOSIS — O26891 Other specified pregnancy related conditions, first trimester: Secondary | ICD-10-CM | POA: Diagnosis present

## 2014-05-20 DIAGNOSIS — Z3A11 11 weeks gestation of pregnancy: Secondary | ICD-10-CM | POA: Insufficient documentation

## 2014-05-20 DIAGNOSIS — R252 Cramp and spasm: Secondary | ICD-10-CM | POA: Diagnosis present

## 2014-05-20 LAB — CBC
HEMATOCRIT: 33.6 % — AB (ref 36.0–46.0)
Hemoglobin: 10.8 g/dL — ABNORMAL LOW (ref 12.0–15.0)
MCH: 23.3 pg — ABNORMAL LOW (ref 26.0–34.0)
MCHC: 32.1 g/dL (ref 30.0–36.0)
MCV: 72.6 fL — AB (ref 78.0–100.0)
PLATELETS: 247 10*3/uL (ref 150–400)
RBC: 4.63 MIL/uL (ref 3.87–5.11)
RDW: 14.6 % (ref 11.5–15.5)
WBC: 12.4 10*3/uL — ABNORMAL HIGH (ref 4.0–10.5)

## 2014-05-20 LAB — HCG, QUANTITATIVE, PREGNANCY: HCG, BETA CHAIN, QUANT, S: 3599 m[IU]/mL — AB (ref <5)

## 2014-05-20 MED ORDER — KETOROLAC TROMETHAMINE 60 MG/2ML IM SOLN
60.0000 mg | Freq: Once | INTRAMUSCULAR | Status: AC
  Administered 2014-05-20: 60 mg via INTRAMUSCULAR
  Filled 2014-05-20: qty 2

## 2014-05-20 MED ORDER — HYDROMORPHONE HCL 1 MG/ML IJ SOLN
1.0000 mg | INTRAMUSCULAR | Status: AC
Start: 2014-05-20 — End: 2014-05-20
  Administered 2014-05-20: 1 mg via INTRAMUSCULAR
  Filled 2014-05-20: qty 1

## 2014-05-20 NOTE — MAU Provider Note (Signed)
History     CSN: [id number]  Arrival date and time: 05/20/14 0865   First Provider Initiated Contact with Patient 05/20/14 [phone]      Chief Complaint  Patient presents with  . Abdominal Cramping   HPI Jackie Faulkner 34 y.o. G3P0020  presents to MAU complaining of intensified pain starting at midnight tonight.  It is a cramping pain that comes and goes every few minutes, 8/10.  She took Percocet at midnight with minimal relief. She has vaginal bleeding that began 4 days ago as spotting but no episodes of heavy bleeding.  She has nausea  but no vomiting, weakness, dizziness, or dysuria.   OB History    Gravida Para Term Preterm AB TAB SAB Ectopic Multiple Living   0      Past Medical History  Diagnosis Date  . Anxiety   . Tachycardia   . Goiter, unspecified   . Chest pain, unspecified   . Eczema   . Ectopic pregnancy June 2015    methotrexate    Past Surgical History  Procedure Laterality Date  . External ear surgery    . Tonsillectomy    . Laparoscopy N/A 10/12/2013    Procedure: LAPAROSCOPY DIAGNOSTIC;  Surgeon: Leslie Andrea, MD;  Location: [facility];  Service: Gynecology;  Laterality: N/A;  patient is in MAU  . Bilateral salpingectomy Bilateral 10/12/2013    Procedure: LEFT  SALPINGECTOMY;  Surgeon: Leslie Andrea, MD;  Location: [facility];  Service: Gynecology;  Laterality: Bilateral;  . Dilation and curettage of uterus N/A 10/12/2013    Procedure: SUCTION DILATATION AND CURETTAGE;  Surgeon: Leslie Andrea, MD;  Location: [facility];  Service: Gynecology;  Laterality: N/A;    Family History  Problem Relation Age of Onset  . Anesthesia problems Neg Hx   . Hypotension Neg Hx   . Pseudochol deficiency Neg Hx   . Malignant hyperthermia Neg Hx   . Diabetes Mother   . Hypertension Mother   . Hypertension Father   . Heart disease Father     History  Substance Use Topics  . Smoking status: Former Smoker    Quit date: 05/05/2005  . Smokeless  tobacco: Never Used  . Alcohol Use: Yes     Comment: occasional.    Allergies:  Allergies  Allergen Reactions  . Amoxicillin Hives  . Penicillins Hives    Her mother is highly allergic so she chooses not to risk adverse side effects.  . Metronidazole Hives    Prescriptions prior to admission  Medication Sig Dispense Refill Last Dose  . acetaminophen (TYLENOL) 500 MG tablet Take 1,000 mg by mouth every 6 (six) hours as needed for headache.   Past Week at Unknown time  . nitrofurantoin, macrocrystal-monohydrate, (MACROBID) 100 MG capsule Take 1 capsule (100 mg total) by mouth 2 (two) times daily. 10 capsule 0 05/19/2014 at Unknown time  . ferrous sulfate 325 (65 FE) MG tablet Take 325 mg by mouth daily with breakfast.   More than a month at Unknown time  . oxyCODONE-acetaminophen (PERCOCET/ROXICET) 5-325 MG per tablet Take 2 tablets by mouth once. 30 tablet 0  at 2400    ROS Pertinent ROS in HPI   Physical Exam   Blood pressure 112/69, pulse 58, temperature 97.9 F (36.6 C), temperature source Oral, resp. rate 18, last menstrual period 03/08/2014, unknown if currently breastfeeding.  Physical Exam  Constitutional: She is oriented to  person, place, and time. She appears well-developed and well-nourished. No distress.  HENT:  Head: Normocephalic and atraumatic.  Eyes: EOM are normal.  Neck: Normal range of motion.  Cardiovascular: Normal rate, regular rhythm and normal heart sounds.   Respiratory: Effort normal and breath sounds normal. No respiratory distress.  GI: Soft. Bowel sounds are normal. She exhibits no distension.  Genitourinary:  Small amt of dark red blood in vagina.   Small amt of blood present in os.   Musculoskeletal: Normal range of motion.  Neurological: She is alert and oriented to person, place, and time. She has normal reflexes.  Skin: Skin is warm and dry.  Psychiatric: She has a normal mood and affect.    MAU Course  Procedures  MDM Dilaudid 1mg  IM  ordered for pt and she indicates complete pain relief with this.  Nausea medication declined. A pos blood type  Options reviewed with pt again: expectant mgmt vs. cytotec vs. D&C.  She again desires expectant management stating that she came in simply because she was unable to handle the pain at home.   Assessment and Plan  A: Incomplete AB  P: Discharge to home OTC ibuprofen 600mg  q 6 hours PRN Continue percocet prn Restart dexilant for reflux prn Keep f/u appt in clinic Patient may return to MAU as needed or if her condition were to change or worsen    Bertram Denvereague Clark, Bryne Lindon E 05/20/2014, 6:18 AM

## 2014-05-20 NOTE — Discharge Instructions (Signed)
Incomplete Miscarriage A miscarriage is the sudden loss of an unborn baby (fetus) before the 20th week of pregnancy. In an incomplete miscarriage, parts of the fetus or placenta (afterbirth) remain in the body.  Having a miscarriage can be an emotional experience. Talk with your health care provider about any questions you may have about miscarrying, the grieving process, and your future pregnancy plans. CAUSES   Problems with the fetal chromosomes that make it impossible for the baby to develop normally. Problems with the baby's genes or chromosomes are most often the result of errors that occur by chance as the embryo divides and grows. The problems are not inherited from the parents.  Infection of the cervix or uterus.  Hormone problems.  Problems with the cervix, such as having an incompetent cervix. This is when the tissue in the cervix is not strong enough to hold the pregnancy.  Problems with the uterus, such as an abnormally shaped uterus, uterine fibroids, or congenital abnormalities.  Certain medical conditions.  Smoking, drinking alcohol, or taking illegal drugs.  Trauma. SYMPTOMS   Vaginal bleeding or spotting, with or without cramps or pain.  Pain or cramping in the abdomen or lower back.  Passing fluid, tissue, or blood clots from the vagina. DIAGNOSIS  Your health care provider will perform a physical exam. You may also have an ultrasound to confirm the miscarriage. Blood or urine tests may also be ordered. TREATMENT   Usually, a dilation and curettage (D&C) procedure is performed. During a D&C procedure, the cervix is widened (dilated) and any remaining fetal or placental tissue is gently removed from the uterus.  Antibiotic medicines are prescribed if there is an infection. Other medicines may be given to reduce the size of the uterus (contract) if there is a lot of bleeding.  If you have Rh negative blood and your baby was Rh positive, you will need a Rho (D)  immune globulin shot. This shot will protect any future baby from having Rh blood problems in future pregnancies.  You may be confined to bed rest. This means you should stay in bed and only get up to use the bathroom. HOME CARE INSTRUCTIONS   Rest as directed by your health care provider.  Restrict activity as directed by your health care provider. You may be allowed to continue light activity if curettage was not done but you require further treatment.  Keep track of the number of pads you use each day. Keep track of how soaked (saturated) they are. Record this information.  Do not  use tampons.  Do not douche or have sexual intercourse until approved by your health care provider.  Keep all follow-up appointments for reevaluation and continuing management.  Only take over-the-counter or prescription medicines for pain, fever, or discomfort as directed by your health care provider.  Take antibiotic medicine as directed by your health care provider. Make sure you finish it even if you start to feel better. SEEK IMMEDIATE MEDICAL CARE IF:   You experience severe cramps in your stomach, back, or abdomen.  You have an unexplained temperature (make sure to record these temperatures).  You pass large clots or tissue (save these for your health care provider to inspect).  Your bleeding increases.  You become light-headed, weak, or have fainting episodes. MAKE SURE YOU:   Understand these instructions.  Will watch your condition.  Will get help right away if you are not doing well or get worse. Document Released: 04/21/2005 Document Revised: 09/05/2013 Document Reviewed:   11/18/2012 ExitCare Patient Information 2015 [employer]. This information is not intended to replace advice given to you by your health care provider. Make sure you discuss any questions you have with your health care provider.  

## 2014-05-20 NOTE — MAU Note (Signed)
Was here 3 days ago and told was having miscarriage. Bad cramping since 2400. Took percocet but not helping. Bleeding

## 2014-06-07 ENCOUNTER — Ambulatory Visit (INDEPENDENT_AMBULATORY_CARE_PROVIDER_SITE_OTHER): Payer: BLUE CROSS/BLUE SHIELD | Admitting: Obstetrics & Gynecology

## 2014-06-07 ENCOUNTER — Encounter: Payer: Self-pay | Admitting: Obstetrics & Gynecology

## 2014-06-07 VITALS — BP 112/61 | HR 78 | Temp 98.6°F | Ht 64.0 in | Wt 195.3 lb

## 2014-06-07 DIAGNOSIS — B373 Candidiasis of vulva and vagina: Secondary | ICD-10-CM | POA: Diagnosis not present

## 2014-06-07 DIAGNOSIS — B3731 Acute candidiasis of vulva and vagina: Secondary | ICD-10-CM

## 2014-06-07 DIAGNOSIS — N96 Recurrent pregnancy loss: Secondary | ICD-10-CM | POA: Insufficient documentation

## 2014-06-07 MED ORDER — FLUCONAZOLE 150 MG PO TABS: 150.0000 mg | ORAL_TABLET | Freq: Once | ORAL | Status: DC

## 2014-06-07 NOTE — Patient Instructions (Signed)
Recurrent Pregnancy Loss  Recurrent pregnancy loss is the loss of three or more pregnancies before 20 weeks of gestation. Losing three or more pregnancies in a row is rare.   CAUSES   The most common cause of recurrent pregnancy loss is an abnormal number of chromosomes in the developing baby (fetus). Chromosomes are the structures inside cells that hold all your genetic material. In most cases of recurrent pregnancy loss, a missing or extra chromosome keeps a baby from developing. It may not be possible to identify which chromosome is defective.  Chromosome abnormalities can be inherited, but most of the time they occur by chance. Other possible causes of recurrent pregnancy loss include:  · Being born with an abnormal womb structure (septate uterus).  · Having noncancerous growths in your uterus (fibroids or polyps).  · Having a disease that causes scarring in your uterus (Asherman syndrome).  · Having a disease that causes your blood to clot (antiphospholipid syndrome).  · Having a disease that increases bleeding (thrombophilia).  RISK FACTORS  The risk of recurrent pregnancy loss increases as your age increases. Other risk factors include:  · Diabetes.  · Thyroid disease.  · Obesity.  · Smoking.  · Excessive alcohol or caffeine.  · Recreational drug use.  SIGNS AND SYMPTOMS  · Vaginal bleeding.  · Passing clots vaginally.  · Abdominal pain or cramps.  · Low back pain.  DIAGNOSIS   Your health care provider can create an image of your womb using sound waves and a computer (ultrasound) to confirm your pregnancy by 5 or 6 weeks after you conceive. Ultrasound can also confirm a pregnancy loss. Your health care provider may do a complete physical exam, including your vagina and uterus (pelvic exam), to find possible causes of recurrent pregnancy loss.   Other tests may include:  · Ultrasound imaging to see if the structure of your uterus is normal or if you have polyps or fibroids.  · Blood tests to see if you have a  disease that causes recurrent pregnancy loss.  · Blood tests to see if your blood clots normally.  · Genetic testing of you and your partner.  TREATMENT   In many cases, there is no specific treatment. Depending on the cause, possible treatments include:  · Having your eggs fertilized outside your uterus (in vitro fertilization). By doing this, a health care provider may be able to select eggs without chromosome abnormalities.  · Taking a blood thinner to prevent clotting if you have antiphospholipid syndrome.  · Having corrective surgery if you have an abnormality in your uterus.  HOME CARE INSTRUCTIONS   Follow all your health care provider's home instructions carefully. These may include:  · Do not smoke or use recreational drugs.  · Do not drink alcohol if you are pregnant.  · Do not put anything in your vagina or have sex for 2 weeks after you have had a miscarriage.  · If you are Rh negative and have had a miscarriage, you may need to get a shot of Rho (D) immune globulin. Ask your doctor if you need this shot.  · Use birth control if you do not want to get pregnant. You can get pregnant [redacted] weeks after a miscarriage.  · Get support from friends and loved ones. Miscarriage can be a sad and stressful event.  SEEK MEDICAL CARE IF:   · You have light vaginal bleeding or spotting while pregnant.  · You have been trying to get pregnant   without success.  · You are struggling with sadness or depression after a miscarriage.  SEEK IMMEDIATE MEDICAL CARE IF:  · You have heavy vaginal bleeding and abdominal cramps.  · You have fever, chills, and severe abdominal pain.   Document Released: 10/08/2007 Document Revised: 04/26/2013 Document Reviewed: 02/11/2013  ExitCare® Patient Information ©2015 [employer]. This information is not intended to replace advice given to you by your health care provider. Make sure you discuss any questions you have with your health care provider.

## 2014-06-07 NOTE — Progress Notes (Signed)
Patient ID: Jackie Faulkner, female   DOB: [birth year], 34 y.o.   MRN: [id number]  Chief Complaint  Patient presents with  . Follow-up    HPI Jackie Faulkner is a 34 y.o. female.  [id number] Patient's last menstrual period was 03/08/2014. S/p complete Sab, was followed by [facility] but was not satisfied with her care. Has had 3 early losses in last year including ectopic.  She wants further evaluation.   HPI  Past Medical History  Diagnosis Date  . Anxiety   . Tachycardia   . Goiter, unspecified   . Chest pain, unspecified   . Eczema   . Ectopic pregnancy June 2015    methotrexate    Past Surgical History  Procedure Laterality Date  . External ear surgery    . Tonsillectomy    . Laparoscopy N/A 10/12/2013    Procedure: LAPAROSCOPY DIAGNOSTIC;  Surgeon: Leslie AndreaJames E Tomblin II, MD;  Location: [facility];  Service: Gynecology;  Laterality: N/A;  patient is in MAU  . Bilateral salpingectomy Bilateral 10/12/2013    Procedure: LEFT  SALPINGECTOMY;  Surgeon: Leslie AndreaJames E Tomblin II, MD;  Location: [facility];  Service: Gynecology;  Laterality: Bilateral;  . Dilation and curettage of uterus N/A 10/12/2013    Procedure: SUCTION DILATATION AND CURETTAGE;  Surgeon: Leslie AndreaJames E Tomblin II, MD;  Location: [facility];  Service: Gynecology;  Laterality: N/A;    Family History  Problem Relation Age of Onset  . Anesthesia problems Neg Hx   . Hypotension Neg Hx   . Pseudochol deficiency Neg Hx   . Malignant hyperthermia Neg Hx   . Diabetes Mother   . Hypertension Mother   . Hypertension Father   . Heart disease Father     Social History History  Substance Use Topics  . Smoking status: Former Smoker    Quit date: 05/05/2005  . Smokeless tobacco: Never Used  . Alcohol Use: Yes     Comment: occasional.    Allergies  Allergen Reactions  . Amoxicillin Hives  . Penicillins Hives    Her mother is highly allergic so she chooses not to risk adverse side effects.  . Metronidazole Hives    Current  Outpatient Prescriptions  Medication Sig Dispense Refill  . acetaminophen (TYLENOL) 500 MG tablet Take 1,000 mg by mouth every 6 (six) hours as needed for headache.    . ferrous sulfate 325 (65 FE) MG tablet Take 325 mg by mouth daily with breakfast.    . fluconazole (DIFLUCAN) 150 MG tablet Take 1 tablet (150 mg total) by mouth once. 1 tablet 1  . nitrofurantoin, macrocrystal-monohydrate, (MACROBID) 100 MG capsule Take 1 capsule (100 mg total) by mouth 2 (two) times daily. (Patient not taking: Reported on 06/07/2014) 10 capsule 0  . oxyCODONE-acetaminophen (PERCOCET/ROXICET) 5-325 MG per tablet Take 2 tablets by mouth once. (Patient not taking: Reported on 06/07/2014) 30 tablet 0   No current facility-administered medications for this visit.    Review of Systems Review of Systems  Constitutional: Negative.   Gastrointestinal: Negative.   Genitourinary: Negative for vaginal bleeding, menstrual problem and pelvic pain.    Blood pressure 112/61, pulse 78, temperature 98.6 F (37 C), temperature source Oral, height 5\' 4"  (1.626 m), weight 195 lb 4.8 oz (88.587 kg), last menstrual period 03/08/2014, not currently breastfeeding.  Physical Exam Physical Exam  Constitutional: She appears well-developed. No distress.  Musculoskeletal: Normal range of motion.  Neurological: She is alert.  Skin: Skin is warm and dry.  Psychiatric: She has a normal mood and affect. Her behavior is normal.    Data Reviewed MAU note [place] and labs  Assessment    Recurrent first trimester losses    Plan    Referral to Dr. April Manson. Prenatal vitamin daily       ARNOLD,JAMES 06/07/2014, 3:18 PM

## 2014-07-10 ENCOUNTER — Encounter: Payer: Self-pay | Admitting: General Practice

## 2014-07-10 NOTE — Progress Notes (Signed)
Nurse from Dr April MansonYalcinkaya office called informing us of Jackie Faulkner's appt with them for 4/28 @ 11am. Called patient to ensure she was aware of appt. Patient states she knows about appt and had no questions

## 2014-07-21 ENCOUNTER — Emergency Department (INDEPENDENT_AMBULATORY_CARE_PROVIDER_SITE_OTHER)
Admission: EM | Admit: 2014-07-21 | Discharge: 2014-07-21 | Disposition: A | Discharge status: Home / Self Care | Payer: BLUE CROSS/BLUE SHIELD | Source: Home / Self Care | Attending: Emergency Medicine | Admitting: Emergency Medicine

## 2014-07-21 ENCOUNTER — Encounter (HOSPITAL_COMMUNITY): Payer: Self-pay

## 2014-07-21 DIAGNOSIS — R220 Localized swelling, mass and lump, head: Secondary | ICD-10-CM

## 2014-07-21 DIAGNOSIS — K088 Other specified disorders of teeth and supporting structures: Secondary | ICD-10-CM

## 2014-07-21 DIAGNOSIS — K0889 Other specified disorders of teeth and supporting structures: Secondary | ICD-10-CM

## 2014-07-21 MED ORDER — HYDROCODONE-ACETAMINOPHEN 7.5-325 MG PO TABS: 1.0000 | ORAL_TABLET | ORAL | Status: DC | PRN

## 2014-07-21 MED ORDER — CLINDAMYCIN HCL 300 MG PO CAPS: 300.0000 mg | ORAL_CAPSULE | Freq: Three times a day (TID) | ORAL | Status: DC

## 2014-07-21 NOTE — ED Notes (Signed)
Patient complains of left sided upper tooth pain that started a couple of weeks ago The pain is affecting her left ear and temple as well

## 2014-07-21 NOTE — Discharge Instructions (Signed)

## 2014-07-21 NOTE — ED Provider Notes (Signed)
CSN: [id number]     Arrival date & time 07/21/14  1627 History   First MD Initiated Contact with Patient 07/21/14 1814     Chief Complaint  Patient presents with  . Dental Pain   (Consider location/radiation/quality/duration/timing/severity/associated sxs/prior Treatment) HPI Comments: 34 year old female is complaining of a toothache for "3-4 weeks". Tooth is located in the left upper molars. She was advised over a year ago to have them extracted but since they were not bothering her she decided not to do that. She has an appointment in May for evaluation for extraction and she has an appointment with a dentist within the next couple of weeks. Pain radiates superiorly into the face.   Past Medical History  Diagnosis Date  . Anxiety   . Tachycardia   . Goiter, unspecified   . Chest pain, unspecified   . Eczema   . Ectopic pregnancy June 2015    methotrexate   Past Surgical History  Procedure Laterality Date  . External ear surgery    . Tonsillectomy    . Laparoscopy N/A 10/12/2013    Procedure: LAPAROSCOPY DIAGNOSTIC;  Surgeon: Leslie Andrea, MD;  Location: [facility];  Service: Gynecology;  Laterality: N/A;  patient is in MAU  . Bilateral salpingectomy Bilateral 10/12/2013    Procedure: LEFT  SALPINGECTOMY;  Surgeon: Leslie Andrea, MD;  Location: [facility];  Service: Gynecology;  Laterality: Bilateral;  . Dilation and curettage of uterus N/A 10/12/2013    Procedure: SUCTION DILATATION AND CURETTAGE;  Surgeon: Leslie Andrea, MD;  Location: [facility];  Service: Gynecology;  Laterality: N/A;   Family History  Problem Relation Age of Onset  . Anesthesia problems Neg Hx   . Hypotension Neg Hx   . Pseudochol deficiency Neg Hx   . Malignant hyperthermia Neg Hx   . Diabetes Mother   . Hypertension Mother   . Hypertension Father   . Heart disease Father    History  Substance Use Topics  . Smoking status: Former Smoker    Quit date: 05/05/2005  . Smokeless tobacco: Never Used   . Alcohol Use: Yes     Comment: occasional.   OB History    Gravida Para Term Preterm AB TAB SAB Ectopic Multiple Living   0     Review of Systems  Constitutional: Negative for fever.  HENT: Positive for dental problem. Negative for ear pain and sore throat.   Respiratory: Negative.   Gastrointestinal: Negative for nausea and vomiting.  All other systems reviewed and are negative.   Allergies  Amoxicillin; Penicillins; and Metronidazole  Home Medications   Prior to Admission medications   Medication Sig Start Date End Date Taking? Authorizing Provider  acetaminophen (TYLENOL) 500 MG tablet Take 1,000 mg by mouth every 6 (six) hours as needed for headache.    Historical Provider, MD  clindamycin (CLEOCIN) 300 MG capsule Take 1 capsule (300 mg total) by mouth 3 (three) times daily. 07/21/14   Hayden Rasmussen, NP  ferrous sulfate 325 (65 FE) MG tablet Take 325 mg by mouth daily with breakfast.    Historical Provider, MD  fluconazole (DIFLUCAN) 150 MG tablet Take 1 tablet (150 mg total) by mouth once. 06/07/14   Adam Phenix, MD  HYDROcodone-acetaminophen (NORCO) 7.5-325 MG per tablet Take 1 tablet by mouth every 4 (four) hours as needed. 07/21/14   Hayden Rasmussen, NP   BP 130/84 mmHg  Pulse 68  Temp(Src) 98.6 F (37 C) (Oral)  Resp 16  SpO2 100% Physical Exam  Constitutional: She is oriented to person, place, and time. She appears well-developed and well-nourished. No distress.  HENT:  Mouth/Throat: Oropharynx is clear and moist. No oropharyngeal exudate.  Left upper second and third molars with surrounding gingival erythema and swelling. The second molar is denied with loss of enamel. Positive for dental tenderness. No abscess appreciated.  Eyes: Conjunctivae and EOM are normal.  Neck: Normal range of motion. Neck supple.  Pulmonary/Chest: Effort normal.  Neurological: She is alert and oriented to person, place, and time. She exhibits normal muscle tone.  Skin: Skin is  warm and dry.  Psychiatric: She has a normal mood and affect.  Nursing note and vitals reviewed.   ED Course  Procedures (including critical care time) Labs Review Labs Reviewed - No data to display  Imaging Review No results found.   MDM   1. Toothache   2. Gingival swelling    Suspect there may be infection involved. Clindamycin for younger milligrams as directed Norco 7.5 mg #15 as directed. Follow-up with dentist as scheduled. May also continue the ibuprofen along with Norco for pain relief.   Hayden Rasmussenavid Breia Ocampo, NP 07/21/14 [phone]

## 2015-05-25 ENCOUNTER — Encounter (HOSPITAL_COMMUNITY): Payer: Self-pay | Admitting: Emergency Medicine

## 2015-05-25 ENCOUNTER — Emergency Department (INDEPENDENT_AMBULATORY_CARE_PROVIDER_SITE_OTHER)
Admission: EM | Admit: 2015-05-25 | Discharge: 2015-05-25 | Disposition: A | Discharge status: Home / Self Care | Payer: BLUE CROSS/BLUE SHIELD | Source: Home / Self Care | Attending: Family Medicine | Admitting: Family Medicine

## 2015-05-25 DIAGNOSIS — M545 Low back pain, unspecified: Secondary | ICD-10-CM

## 2015-05-25 MED ORDER — NAPROXEN 500 MG PO TABS: 500.0000 mg | ORAL_TABLET | Freq: Two times a day (BID) | ORAL | Status: DC

## 2015-05-25 NOTE — ED Provider Notes (Signed)
CSN: [id number]     Arrival date & time 05/25/15  1302 History   First MD Initiated Contact with Patient 05/25/15 1327     Chief Complaint  Patient presents with  . Back Pain   (Consider location/radiation/quality/duration/timing/severity/associated sxs/prior Treatment) HPI History obtained from patient:   LOCATION:back pain SEVERITY: DURATION: 4-5 days CONTEXT: sudden onset of sharp pain in back while walking at work QUALITY:sharp MODIFYING FACTORS:heat, ibuprofen, icy hot ASSOCIATED SYMPTOMS: pain radiates into right leg TIMING: episodic OCCUPATION: pharmacy  Past Medical History  Diagnosis Date  . Anxiety   . Tachycardia   . Goiter, unspecified   . Chest pain, unspecified   . Eczema   . Ectopic pregnancy June 2015    methotrexate   Past Surgical History  Procedure Laterality Date  . External ear surgery    . Tonsillectomy    . Laparoscopy N/A 10/12/2013    Procedure: LAPAROSCOPY DIAGNOSTIC;  Surgeon: Leslie Andrea, MD;  Location: [facility];  Service: Gynecology;  Laterality: N/A;  patient is in [facility]  . Bilateral salpingectomy Bilateral 10/12/2013    Procedure: LEFT  SALPINGECTOMY;  Surgeon: Leslie Andrea, MD;  Location: [facility];  Service: Gynecology;  Laterality: Bilateral;  . Dilation and curettage of uterus N/A 10/12/2013    Procedure: SUCTION DILATATION AND CURETTAGE;  Surgeon: Leslie Andrea, MD;  Location: [facility];  Service: Gynecology;  Laterality: N/A;   Family History  Problem Relation Age of Onset  . Anesthesia problems Neg Hx   . Hypotension Neg Hx   . Pseudochol deficiency Neg Hx   . Malignant hyperthermia Neg Hx   . Diabetes Mother   . Hypertension Mother   . Hypertension Father   . Heart disease Father    Social History  Substance Use Topics  . Smoking status: Former Smoker    Quit date: 05/05/2005  . Smokeless tobacco: Never Used  . Alcohol Use: Yes     Comment: occasional.   OB History    Gravida Para Term Preterm AB TAB SAB Ectopic  Multiple Living   0     Review of Systems ROS +'ve back pain  DENIES; CHANGE IN ACTIVITY, CONGESTION, HEADACHE, CHEST PAIN, ABDOMINAL PAIN, SHORTNESS OF BREATH, WHEEZING, EXCESSIVE THIRST OR URINATION, SKIN RASH, DIFFICULTY WITH URINATION, DYSURIA, AGITATION, BALANCE ISSUES  Allergies  Amoxicillin; Penicillins; and Metronidazole  Home Medications   Prior to Admission medications   Medication Sig Start Date End Date Taking? Authorizing Provider  acetaminophen (TYLENOL) 500 MG tablet Take 1,000 mg by mouth every 6 (six) hours as needed for headache.    Historical Provider, MD  clindamycin (CLEOCIN) 300 MG capsule Take 1 capsule (300 mg total) by mouth 3 (three) times daily. 07/21/14   Hayden Rasmussen, NP  ferrous sulfate 325 (65 FE) MG tablet Take 325 mg by mouth daily with breakfast.    Historical Provider, MD  fluconazole (DIFLUCAN) 150 MG tablet Take 1 tablet (150 mg total) by mouth once. 06/07/14   Adam Phenix, MD  HYDROcodone-acetaminophen (NORCO) 7.5-325 MG per tablet Take 1 tablet by mouth every 4 (four) hours as needed. 07/21/14   Hayden Rasmussen, NP   Meds Ordered and Administered this Visit  Medications - No data to display  BP 109/78 mmHg  Pulse 76  Temp(Src) 98.4 F (36.9 C) (Oral)  Resp 16  SpO2 100% No data found.   Physical Exam  Constitutional: She is oriented to  person, place, and time. She appears well-developed and well-nourished. No distress.  HENT:  Head: Normocephalic and atraumatic.  Right Ear: External ear normal.  Left Ear: External ear normal.  Eyes: Conjunctivae are normal.  Pulmonary/Chest: Effort normal and breath sounds normal.  Abdominal: Soft.  Musculoskeletal: She exhibits tenderness.       Lumbar back: She exhibits tenderness and spasm.  Neurological: She is alert and oriented to person, place, and time.  Nursing note and vitals reviewed.   ED Course  Procedures (including critical care time)  Labs Review Labs Reviewed - No data  to display  Imaging Review No results found.   Visual Acuity Review  Right Eye Distance:   Left Eye Distance:   Bilateral Distance:    Right Eye Near:   Left Eye Near:    Bilateral Near:         MDM   1. Right-sided low back pain without sciatica    Suggest symptomatic treatment Rx for naproxen is provided Discharged home in stable condition    Tharon Aquas, [place] 05/25/15 1525

## 2015-05-25 NOTE — ED Notes (Signed)
C/o lower back pain onset x3 days... Denies inj/trauma, urinary sx Pain increases w/activity A&O x4... No acute distress.

## 2015-05-25 NOTE — Discharge Instructions (Signed)
Your back pain is episodic which means it comes and goes as it pleases.   I do not find any serious problems at this time  The sharp catching sensation may be a bit of right hip tendonitis.   Continue heat to the area  Naproxen may give you more relief than ibuprofen

## 2015-07-08 ENCOUNTER — Emergency Department (HOSPITAL_COMMUNITY): Payer: BLUE CROSS/BLUE SHIELD

## 2015-07-08 ENCOUNTER — Encounter (HOSPITAL_COMMUNITY): Payer: Self-pay | Admitting: *Deleted

## 2015-07-08 ENCOUNTER — Emergency Department (HOSPITAL_COMMUNITY)
Admission: EM | Admit: 2015-07-08 | Discharge: 2015-07-08 | Disposition: A | Discharge status: Home / Self Care | Payer: BLUE CROSS/BLUE SHIELD | Attending: Emergency Medicine | Admitting: Emergency Medicine

## 2015-07-08 DIAGNOSIS — R61 Generalized hyperhidrosis: Secondary | ICD-10-CM | POA: Diagnosis not present

## 2015-07-08 DIAGNOSIS — R109 Unspecified abdominal pain: Secondary | ICD-10-CM | POA: Insufficient documentation

## 2015-07-08 DIAGNOSIS — Z872 Personal history of diseases of the skin and subcutaneous tissue: Secondary | ICD-10-CM | POA: Diagnosis not present

## 2015-07-08 DIAGNOSIS — Z8639 Personal history of other endocrine, nutritional and metabolic disease: Secondary | ICD-10-CM | POA: Insufficient documentation

## 2015-07-08 DIAGNOSIS — Z3202 Encounter for pregnancy test, result negative: Secondary | ICD-10-CM | POA: Insufficient documentation

## 2015-07-08 DIAGNOSIS — R1110 Vomiting, unspecified: Secondary | ICD-10-CM | POA: Diagnosis not present

## 2015-07-08 DIAGNOSIS — Z792 Long term (current) use of antibiotics: Secondary | ICD-10-CM | POA: Insufficient documentation

## 2015-07-08 DIAGNOSIS — Z8659 Personal history of other mental and behavioral disorders: Secondary | ICD-10-CM | POA: Insufficient documentation

## 2015-07-08 DIAGNOSIS — R197 Diarrhea, unspecified: Secondary | ICD-10-CM | POA: Diagnosis present

## 2015-07-08 DIAGNOSIS — Z791 Long term (current) use of non-steroidal anti-inflammatories (NSAID): Secondary | ICD-10-CM | POA: Insufficient documentation

## 2015-07-08 DIAGNOSIS — Z87891 Personal history of nicotine dependence: Secondary | ICD-10-CM | POA: Insufficient documentation

## 2015-07-08 DIAGNOSIS — Z79899 Other long term (current) drug therapy: Secondary | ICD-10-CM | POA: Insufficient documentation

## 2015-07-08 DIAGNOSIS — R198 Other specified symptoms and signs involving the digestive system and abdomen: Secondary | ICD-10-CM | POA: Diagnosis not present

## 2015-07-08 DIAGNOSIS — D649 Anemia, unspecified: Secondary | ICD-10-CM | POA: Diagnosis not present

## 2015-07-08 DIAGNOSIS — Z880 Allergy status to penicillin: Secondary | ICD-10-CM | POA: Insufficient documentation

## 2015-07-08 LAB — URINALYSIS, ROUTINE W REFLEX MICROSCOPIC
Bilirubin Urine: NEGATIVE
GLUCOSE, UA: NEGATIVE mg/dL
HGB URINE DIPSTICK: NEGATIVE
KETONES UR: 40 mg/dL — AB
LEUKOCYTES UA: NEGATIVE
NITRITE: NEGATIVE
PROTEIN: 30 mg/dL — AB
SPECIFIC GRAVITY, URINE: 1.031 — AB (ref 1.005–1.030)
pH: 7 (ref 5.0–8.0)

## 2015-07-08 LAB — COMPREHENSIVE METABOLIC PANEL
ALT: 18 U/L (ref 14–54)
ANION GAP: 11 (ref 5–15)
AST: 25 U/L (ref 15–41)
Albumin: 3.9 g/dL (ref 3.5–5.0)
Alkaline Phosphatase: 44 U/L (ref 38–126)
BUN: 11 mg/dL (ref 6–20)
CO2: 20 mmol/L — ABNORMAL LOW (ref 22–32)
Calcium: 8.9 mg/dL (ref 8.9–10.3)
Chloride: 107 mmol/L (ref 101–111)
Creatinine, Ser: 0.89 mg/dL (ref 0.44–1.00)
GFR calc Af Amer: >60 mL/min (ref >60)
Glucose, Bld: 104 mg/dL — ABNORMAL HIGH (ref 65–99)
Potassium: 3.4 mmol/L — ABNORMAL LOW (ref 3.5–5.1)
Sodium: 138 mmol/L (ref 135–145)
Total Bilirubin: 0.4 mg/dL (ref 0.3–1.2)
Total Protein: 8.1 g/dL (ref 6.5–8.1)

## 2015-07-08 LAB — CBC WITH DIFFERENTIAL/PLATELET
BASOS PCT: 0 %
Basophils Absolute: 0 10*3/uL (ref 0.0–0.1)
EOS PCT: 0 %
Eosinophils Absolute: 0 10*3/uL (ref 0.0–0.7)
HCT: 36 % (ref 36.0–46.0)
HEMOGLOBIN: 11.2 g/dL — AB (ref 12.0–15.0)
LYMPHS PCT: 9 %
Lymphs Abs: 1.2 10*3/uL (ref 0.7–4.0)
MCH: 22.1 pg — ABNORMAL LOW (ref 26.0–34.0)
MCHC: 31.1 g/dL (ref 30.0–36.0)
MCV: 71.1 fL — AB (ref 78.0–100.0)
MONO ABS: 0.6 10*3/uL (ref 0.1–1.0)
Monocytes Relative: 4 %
NEUTROS PCT: 87 %
Neutro Abs: 12 10*3/uL — ABNORMAL HIGH (ref 1.7–7.7)
Platelets: 279 10*3/uL (ref 150–400)
RBC: 5.06 MIL/uL (ref 3.87–5.11)
RDW: 14.5 % (ref 11.5–15.5)
WBC: 13.8 10*3/uL — ABNORMAL HIGH (ref 4.0–10.5)

## 2015-07-08 LAB — URINE MICROSCOPIC-ADD ON
RBC / HPF: NONE SEEN RBC/hpf (ref 0–5)
WBC, UA: NONE SEEN WBC/hpf (ref 0–5)

## 2015-07-08 LAB — LIPASE, BLOOD: LIPASE: 24 U/L (ref 11–51)

## 2015-07-08 LAB — PREGNANCY, URINE: Preg Test, Ur: NEGATIVE

## 2015-07-08 LAB — I-STAT BETA HCG BLOOD, ED (MC, WL, AP ONLY): I-stat hCG, quantitative: 6.9 m[IU]/mL — ABNORMAL HIGH (ref <5)

## 2015-07-08 MED ORDER — DICYCLOMINE HCL 20 MG PO TABS: 20.0000 mg | ORAL_TABLET | Freq: Three times a day (TID) | ORAL | Status: DC

## 2015-07-08 MED ORDER — DICYCLOMINE HCL 20 MG PO TABS
20.0000 mg | ORAL_TABLET | Freq: Once | ORAL | Status: AC
  Administered 2015-07-08: 20 mg via ORAL
  Filled 2015-07-08: qty 1

## 2015-07-08 MED ORDER — ONDANSETRON HCL 4 MG PO TABS: 4.0000 mg | ORAL_TABLET | Freq: Three times a day (TID) | ORAL | Status: DC | PRN

## 2015-07-08 NOTE — ED Notes (Signed)
Bed: WA06 Expected date: 07/08/15 Expected time: 7:17 AM Means of arrival: Ambulance Comments: Constipation, N/V

## 2015-07-08 NOTE — Discharge Instructions (Signed)
Abdominal Pain, Adult °Many things can cause abdominal pain. Usually, abdominal pain is not caused by a disease and will improve without treatment. It can often be observed and treated at home. Your health care provider will do a physical exam and possibly order blood tests and X-rays to help determine the seriousness of your pain. However, in many cases, more time must pass before a clear cause of the pain can be found. Before that point, your health care provider may not know if you need more testing or further treatment. °HOME CARE INSTRUCTIONS °Monitor your abdominal pain for any changes. The following actions may help to alleviate any discomfort you are experiencing: °· Only take over-the-counter or prescription medicines as directed by your health care provider. °· Do not take laxatives unless directed to do so by your health care provider. °· Try a clear liquid diet (broth, tea, or water) as directed by your health care provider. Slowly move to a bland diet as tolerated. °SEEK MEDICAL CARE IF: °· You have unexplained abdominal pain. °· You have abdominal pain associated with nausea or diarrhea. °· You have pain when you urinate or have a bowel movement. °· You experience abdominal pain that wakes you in the night. °· You have abdominal pain that is worsened or improved by eating food. °· You have abdominal pain that is worsened with eating fatty foods. °· You have a fever. °SEEK IMMEDIATE MEDICAL CARE IF: °· Your pain does not go away within 2 hours. °· You keep throwing up (vomiting). °· Your pain is felt only in portions of the abdomen, such as the right side or the left lower portion of the abdomen. °· You pass bloody or black tarry stools. °MAKE SURE YOU: °· Understand these instructions. °· Will watch your condition. °· Will get help right away if you are not doing well or get worse. °  °This information is not intended to replace advice given to you by your health care provider. Make sure you discuss  any questions you have with your health care provider. °  °Document Released: 01/29/2005 Document Revised: 01/10/2015 Document Reviewed: 12/29/2012 °Elsevier Interactive Patient Education ©2016 Elsevier Inc. ° °Diarrhea °Diarrhea is frequent loose and watery bowel movements. It can cause you to feel weak and dehydrated. Dehydration can cause you to become tired and thirsty, have a dry mouth, and have decreased urination that often is dark yellow. Diarrhea is a sign of another problem, most often an infection that will not last long. In most cases, diarrhea typically lasts 2-3 days. However, it can last longer if it is a sign of something more serious. It is important to treat your diarrhea as directed by your caregiver to lessen or prevent future episodes of diarrhea. °CAUSES  °Some common causes include: °· Gastrointestinal infections caused by viruses, bacteria, or parasites. °· Food poisoning or food allergies. °· Certain medicines, such as antibiotics, chemotherapy, and laxatives. °· Artificial sweeteners and fructose. °· Digestive disorders. °HOME CARE INSTRUCTIONS °· Ensure adequate fluid intake (hydration): Have 1 cup (8 oz) of fluid for each diarrhea episode. Avoid fluids that contain simple sugars or sports drinks, fruit juices, whole milk products, and sodas. Your urine should be clear or pale yellow if you are drinking enough fluids. Hydrate with an oral rehydration solution that you can purchase at pharmacies, retail stores, and online. You can prepare an oral rehydration solution at home by mixing the following ingredients together: °¨  - tsp table salt. °¨ ¾ tsp baking soda. °¨    tsp salt substitute containing potassium chloride.  1  tablespoons sugar.  1 L (34 oz) of water.  Certain foods and beverages may increase the speed at which food moves through the gastrointestinal (GI) tract. These foods and beverages should be avoided and include:  Caffeinated and alcoholic beverages.  High-fiber  foods, such as raw fruits and vegetables, nuts, seeds, and whole grain breads and cereals.  Foods and beverages sweetened with sugar alcohols, such as xylitol, sorbitol, and mannitol.  Some foods may be well tolerated and may help thicken stool including:  Starchy foods, such as rice, toast, pasta, low-sugar cereal, oatmeal, grits, baked potatoes, crackers, and bagels.  Bananas.  Applesauce.  Add probiotic-rich foods to help increase healthy bacteria in the GI tract, such as yogurt and fermented milk products.  Wash your hands well after each diarrhea episode.  Only take over-the-counter or prescription medicines as directed by your caregiver.  Take a warm bath to relieve any burning or pain from frequent diarrhea episodes. SEEK IMMEDIATE MEDICAL CARE IF:   You are unable to keep fluids down.  You have persistent vomiting.  You have blood in your stool, or your stools are black and tarry.  You do not urinate in 6-8 hours, or there is only a small amount of very dark urine.  You have abdominal pain that increases or localizes.  You have weakness, dizziness, confusion, or light-headedness.  You have a severe headache.  Your diarrhea gets worse or does not get better.  You have a fever or persistent symptoms for more than 2-3 days.  You have a fever and your symptoms suddenly get worse. MAKE SURE YOU:   Understand these instructions.  Will watch your condition.  Will get help right away if you are not doing well or get worse.   This information is not intended to replace advice given to you by your health care provider. Make sure you discuss any questions you have with your health care provider.   Document Released: 04/11/2002 Document Revised: 05/12/2014 Document Reviewed: 12/28/2011 Elsevier Interactive Patient Education 2016 Elsevier Inc. Bisacodyl tablets and capsules What is this medicine? BISACODYL (bis a KOE dill) is a laxative. This medicine is used to  relieve constipation. It may also be used to empty and prepare the bowel for surgery or examination. This medicine may be used for other purposes; ask your health care provider or pharmacist if you have questions. What should I tell my health care provider before I take this medicine? They need to know if you have any of these conditions -appendicitis -persistent constipation -stomach pain or blockage -ulcerative colitis or other bowel disease -an unusual or allergic reaction to bisacodyl, other medicines, foods, dyes, or preservatives -pregnant or trying to get pregnant How should I use this medicine? Take this medicine by mouth with a glass of water. Follow the directions on the prescription label. Swallow the tablets whole. Do not crush or chew. Do not take this medicine more often than directed. Talk to your pediatrician regarding the use of this medicine in children. While this medicine may be used in children as young as 6 years for selected conditions, precautions do apply. Overdosage: If you think you have taken too much of this medicine contact a poison control center or emergency room at once. NOTE: This medicine is only for you. Do not share this medicine with others. What if I miss a dose? This does not apply. This medicine is not for regular use, and should only  be used as needed. What may interact with this medicine? -antacids -h2-blockers like cimetidine, famotidine, nizatidine, and ranitidine -proton pump inhibitors like esomeprazole, omeprazole, pantoprazole, and rabeprazole This list may not describe all possible interactions. Give your health care provider a list of all the medicines, herbs, non-prescription drugs, or dietary supplements you use. Also tell them if you smoke, drink alcohol, or use illegal drugs. Some items may interact with your medicine. What should I watch for while using this medicine? Do not use this medicine for longer than directed by your doctor or  health care professional. This medicine can be habit-forming. Long-term use can make your body depend on the laxative for regular bowel movements, damage the bowel, cause malnutrition, and problems with the amounts of water and salts in your body. If your constipation keeps returning, check with your doctor or health care professional. Do not take this medicine within 1 hour of taking antacids or eating dairy products like milk or yogurt. These items can destroy the protective coating on the tablets and increase stomach upset and cramps. If you do not have a bowel movement within 12 hours after using this medicine or you experience rectal bleeding, contact your doctor or health care professional. These may be signs of a more serious condition. What side effects may I notice from receiving this medicine? Side effects that you should report to your doctor or health care professional as soon as possible: -diarrhea -muscle weakness -nausea, vomiting -unusual weight loss Side effects that usually do not require medical attention (report to your doctor or health care professional if they continue or are bothersome): -bloating -discolored urine -lower stomach discomfort or cramps -rectal itching, burning, or swelling This list may not describe all possible side effects. Call your doctor for medical advice about side effects. You may report side effects to FDA at 1-800-FDA-1088. Where should I keep my medicine? Keep out of the reach of children. Store at room temperature below 25 degrees C (77 degrees F). Protect from moisture. Throw away any unused medicine after the expiration date. NOTE: This sheet is a summary. It may not cover all possible information. If you have questions about this medicine, talk to your doctor, pharmacist, or health care provider.    2016, Elsevier/Gold Standard. (2007-07-22 12:55:59)

## 2015-07-08 NOTE — ED Provider Notes (Signed)
CSN: [id number]     Arrival date & time 07/08/15  0718 History   First MD Initiated Contact with Patient 07/08/15 [phone]     Chief Complaint  Patient presents with  . Constipation     (Consider location/radiation/quality/duration/timing/severity/associated sxs/prior Treatment) HPI  Jackie Faulkner Is a 35 year old female with a past medical history of ectopic pregnancy and bilateral salpingectomy who presents emergency Department with chief complaint of abdominal pain, diarrhea and vomiting. The patient has a history of anemia as well as begun on iron supplementation. She was unable to make a bowel movement for one week. Last night the patient took a Dulcolax to relieve her constipation. This morning she awoke with severe abdominal cramping, tenesmus, intermittent diaphoresis, and 2 episodes of vomiting. She denies any recent foreign travel, exposure to contacts with similar symptoms, recent ingestion of suspicious foods. She has no other history of abdominal surgeries. Currently, she just complains of abdominal cramps. Her nausea is improved. She denies fevers, myalgias, or other signs of systemic infection.  Past Medical History  Diagnosis Date  . Anxiety   . Tachycardia   . Goiter, unspecified   . Chest pain, unspecified   . Eczema   . Ectopic pregnancy June 2015    methotrexate   Past Surgical History  Procedure Laterality Date  . External ear surgery    . Tonsillectomy    . Laparoscopy N/A 10/12/2013    Procedure: LAPAROSCOPY DIAGNOSTIC;  Surgeon: Leslie Andrea, MD;  Location: [facility];  Service: Gynecology;  Laterality: N/A;  patient is in [facility]  . Bilateral salpingectomy Bilateral 10/12/2013    Procedure: LEFT  SALPINGECTOMY;  Surgeon: Leslie Andrea, MD;  Location: [facility];  Service: Gynecology;  Laterality: Bilateral;  . Dilation and curettage of uterus N/A 10/12/2013    Procedure: SUCTION DILATATION AND CURETTAGE;  Surgeon: Leslie Andrea, MD;  Location: [facility];  Service:  Gynecology;  Laterality: N/A;   Family History  Problem Relation Age of Onset  . Anesthesia problems Neg Hx   . Hypotension Neg Hx   . Pseudochol deficiency Neg Hx   . Malignant hyperthermia Neg Hx   . Diabetes Mother   . Hypertension Mother   . Hypertension Father   . Heart disease Father    Social History  Substance Use Topics  . Smoking status: Former Smoker    Quit date: 05/05/2005  . Smokeless tobacco: Never Used  . Alcohol Use: Yes     Comment: occasional.   OB History    Gravida Para Term Preterm AB TAB SAB Ectopic Multiple Living   0     Review of Systems  Ten systems reviewed and are negative for acute change, except as noted in the HPI.    Allergies  Amoxicillin; Penicillins; and Metronidazole  Home Medications   Prior to Admission medications   Medication Sig Start Date End Date Taking? Authorizing Provider  acetaminophen (TYLENOL) 500 MG tablet Take 1,000 mg by mouth every 6 (six) hours as needed for headache.    Historical Provider, MD  clindamycin (CLEOCIN) 300 MG capsule Take 1 capsule (300 mg total) by mouth 3 (three) times daily. 07/21/14   Hayden Rasmussen, NP  ferrous sulfate 325 (65 FE) MG tablet Take 325 mg by mouth daily with breakfast.    Historical Provider, MD  fluconazole (DIFLUCAN) 150 MG tablet Take 1 tablet (150 mg total) by mouth once. 06/07/14  Adam PhenixJames G Arnold, MD  HYDROcodone-acetaminophen (NORCO) 7.5-325 MG per tablet Take 1 tablet by mouth every 4 (four) hours as needed. 07/21/14   Hayden Rasmussenavid Mabe, NP  naproxen (NAPROSYN) 500 MG tablet Take 1 tablet (500 mg total) by mouth 2 (two) times daily with a meal. 05/25/15   Tharon AquasFrank C Patrick, PA   There were no vitals taken for this visit. Physical Exam  Constitutional: She is oriented to person, place, and time. She appears well-developed and well-nourished. No distress.  HENT:  Head: Normocephalic and atraumatic.  Eyes: Conjunctivae are normal. No scleral icterus.  Neck: Normal range of  motion.  Cardiovascular: Normal rate, regular rhythm and normal heart sounds.  Exam reveals no gallop and no friction rub.   No murmur heard. Pulmonary/Chest: Effort normal and breath sounds normal. No respiratory distress.  Abdominal: Soft. Bowel sounds are normal. She exhibits no distension and no mass. There is no tenderness. There is no guarding.  Minimal tenderness with palpation of the abdomen. Bowel sounds are quiet and infrequent  Neurological: She is alert and oriented to person, place, and time.  Skin: Skin is warm and dry. She is not diaphoretic.    ED Course  Procedures (including critical care time) Labs Review Labs Reviewed - No data to display  Imaging Review No results found. I have personally reviewed and evaluated these images and lab results as part of my medical decision-making.   EKG Interpretation None      MDM   Final diagnoses:  Abdominal cramping  Diarrhea, unspecified type    7:55 AM BP 119/80 mmHg  Pulse 80  Temp(Src) 97.4 F (36.3 C) (Oral)  Resp 15  SpO2 100% Patient here with abdominal cramping, diarrhea, tenesmus, 2 episodes of vomiting. I suspect that this is likely secondary to the use of bowel stimulant. She's had about 7 watery stools this morning. Differential includes small bowel obstruction, gastroenteritis, less likely to be an organ mediated abdominal issue such as cholecystitis, appendicitis. Pain is diffuse. I have ordered a complete metabolic panel, complete blood count, lipase, and an acute abdominal plain film series. The patient needs a pregnancy test. I have also ordered 20 mg by mouth, Bentyl for abdominal cramping.  9:14 AM BP 119/80 mmHg  Pulse 80  Temp(Src) 97.4 F (36.3 C) (Oral)  Resp 15  SpO2 100%  LMP 06/07/2015 Patient with slightly elevated hCG. I discussed this with Dr. Lynelle DoctorKnapp. They feel that this is unlikely representative of a pregnancy. However, I discussed this with the patient. She agrees to go ahead with  abdominal x-ray. She is signed a consent form. I discussed along with the radiology supervisor. The amount of radiation She will receive if there were a pregnancy. She appears to understand this information and agrees with proceeding with the imaging.   10:29 AM BP 119/80 mmHg  Pulse 80  Temp(Src) 97.4 F (36.3 C) (Oral)  Resp 15  SpO2 100%  LMP 06/07/2015 Patient improved with oral Bentyl. No repeat episodes of vomiting. She does have a leukocytosis. She may have a viral gastroenteritis versus stress reaction with elevation of the white blood cell count. The patient will be discharged with Zofran and Bentyl. Discussed return precautions. I reviewed the imaging, which shows no signs of obstruction or constipation. Discussed the need for oral rehydration. Patient is provided with return precautions. She appears safe for discharge at this time.  Arthor Captainbigail Marcheta Horsey, PA-C 07/08/15 1030  Devoria AlbeIva Knapp, MD 07/14/15 2300

## 2015-07-08 NOTE — ED Notes (Signed)
Patient is alert and oriented x3.  She was given DC instructions and follow up visit instructions.  Patient gave verbal understanding. She was DC ambulatory under her own power to home.  V/S stable.  He was not showing any signs of distress on DC 

## 2015-07-08 NOTE — ED Notes (Signed)
EMS called to home.  Found patient with complaints of abdominal pain that started a week ago.  Patient states that she started a new iron medication last week and has not had a BM since Sunday. Patient has taken a dulcolax this morning with no relief.  Currently she rates her pain 10 of 10.

## 2015-08-04 IMAGING — CT CT ANGIO CHEST
2 of 10 series · 18 of 46 positions shown · IV contrast (APPLIED)
Comparison: Chest radiograph 02/03/2013.  Chest CT 05/11/2009.

CLINICAL DATA: Nausea. Vomiting. Left lower quadrant pain.
Tachycardia. Cough. Pulmonary embolism.

EXAM:
CT ANGIOGRAPHY CHEST WITH CONTRAST
TECHNIQUE: Multidetector CT imaging of the chest was performed using the
standard protocol during bolus administration of intravenous
contrast. Multiplanar CT image reconstructions including MIPs were
obtained to evaluate the vascular anatomy.
CONTRAST:  100mL OMNIPAQUE IOHEXOL 350 MG/ML SOLN

[Series 7: thins · axial · 0.65mm/px · z∈[-254,-51]mm · 15 of 225 slices shown]
[im 11/225  lung]
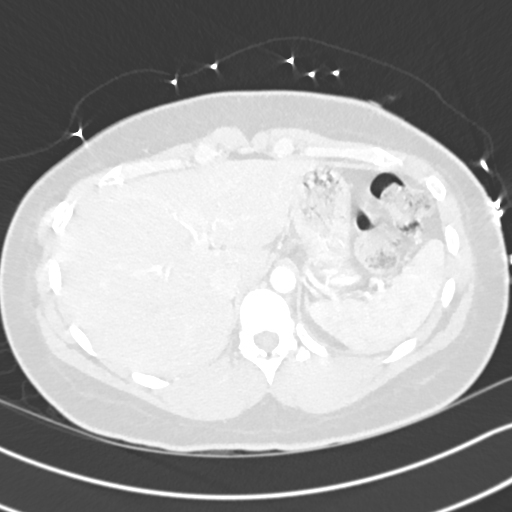
[im 33/225  soft-tissue]
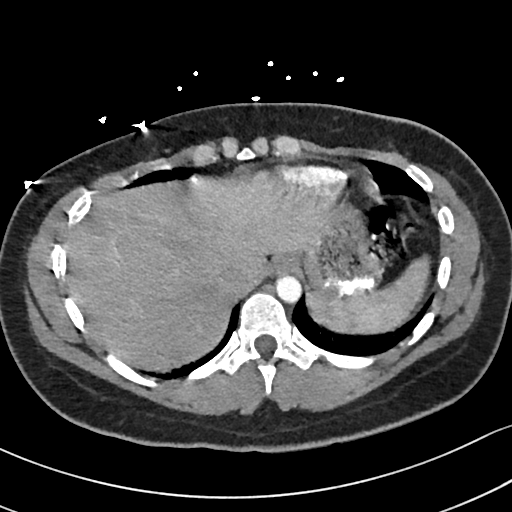
[im 43/225  lung]
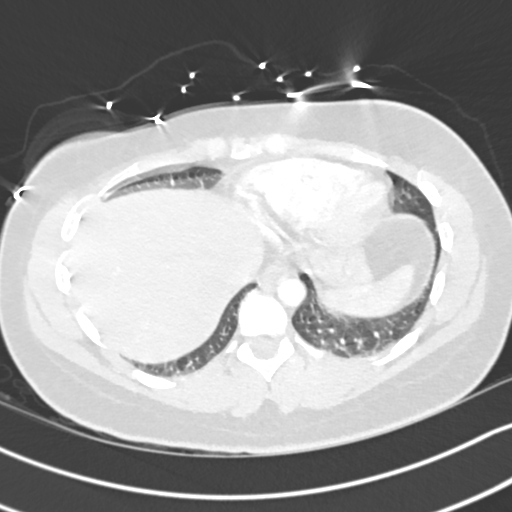
[im 54/225  soft-tissue]
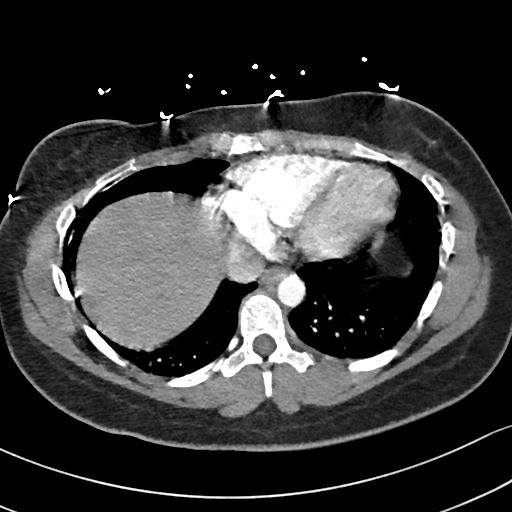
[im 75/225  lung]
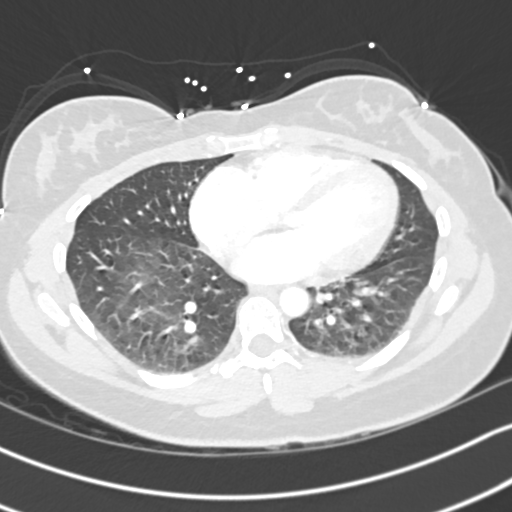
[im 86/225  soft-tissue]
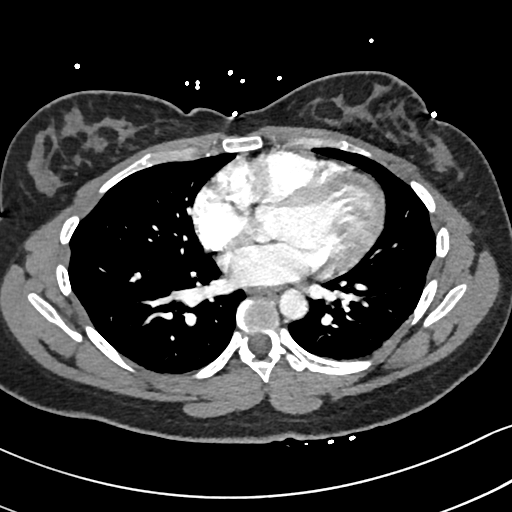
[im 97/225  lung]
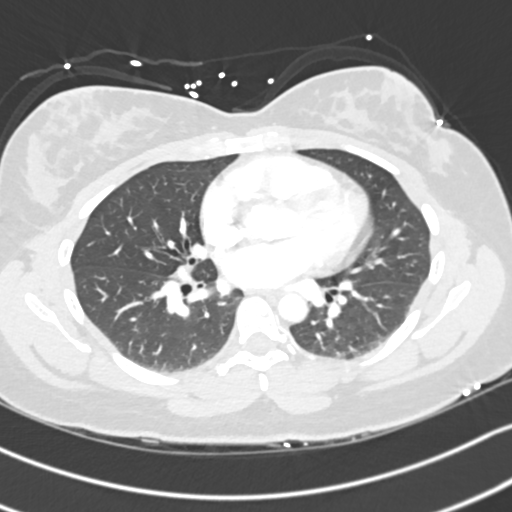
[im 118/225  soft-tissue]
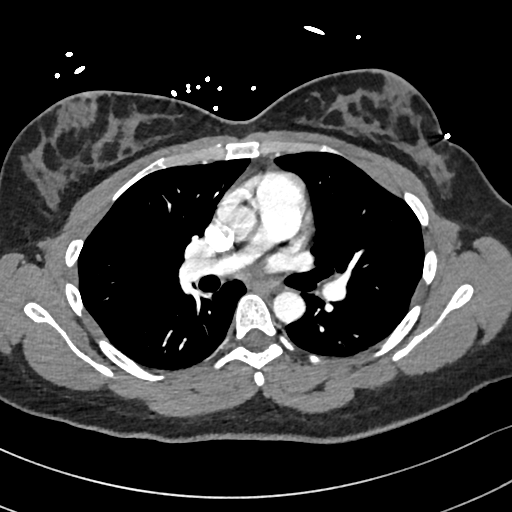
[im 129/225  lung]
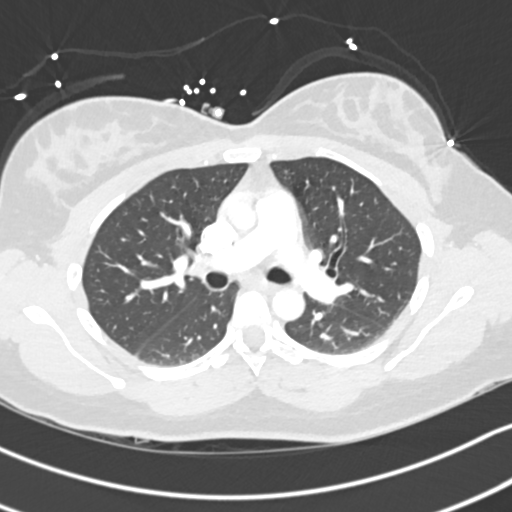
[im 139/225  soft-tissue]
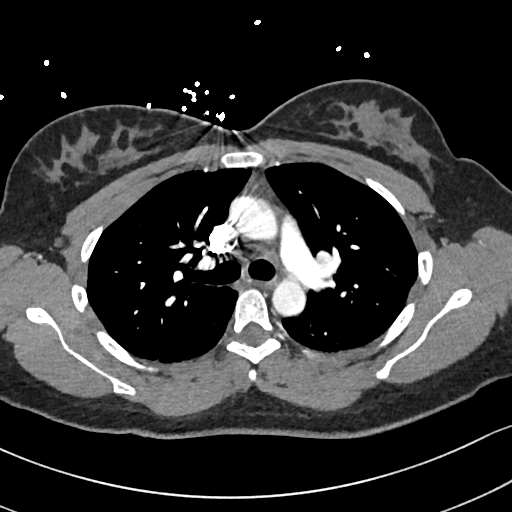
[im 161/225  lung]
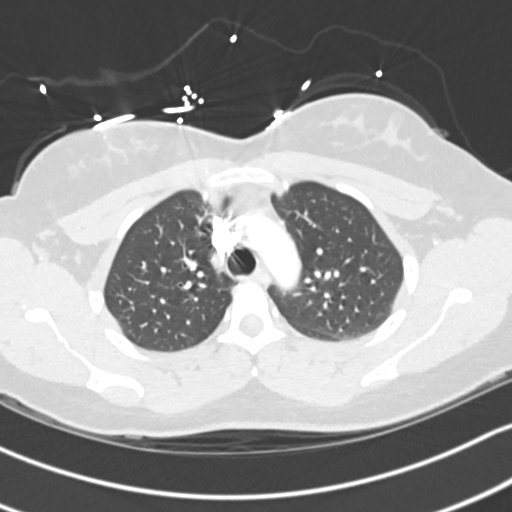
[im 171/225  soft-tissue]
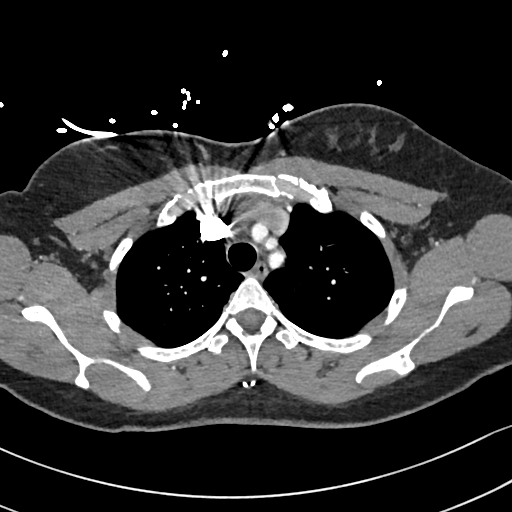
[im 182/225  lung]
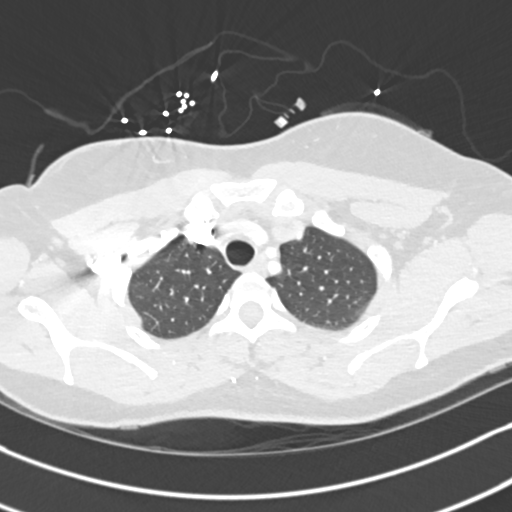
[im 203/225  soft-tissue]
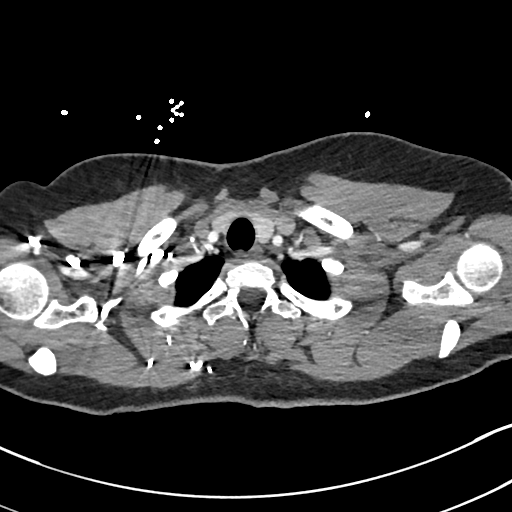
[im 214/225  lung]
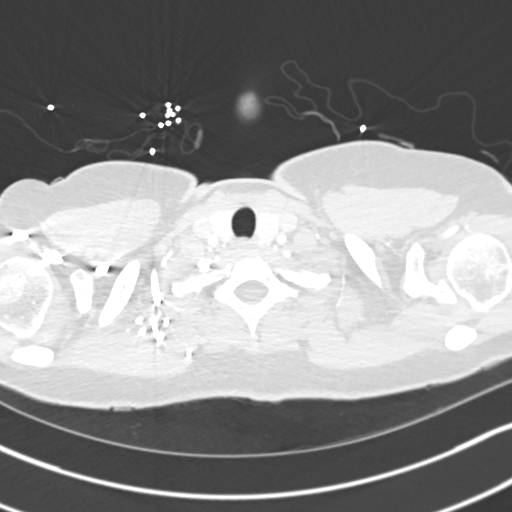

[Series 9: coronal mpr · coronal · 0.43mm/px · 3 of 107 slices shown]
[im 27/107  soft-tissue]
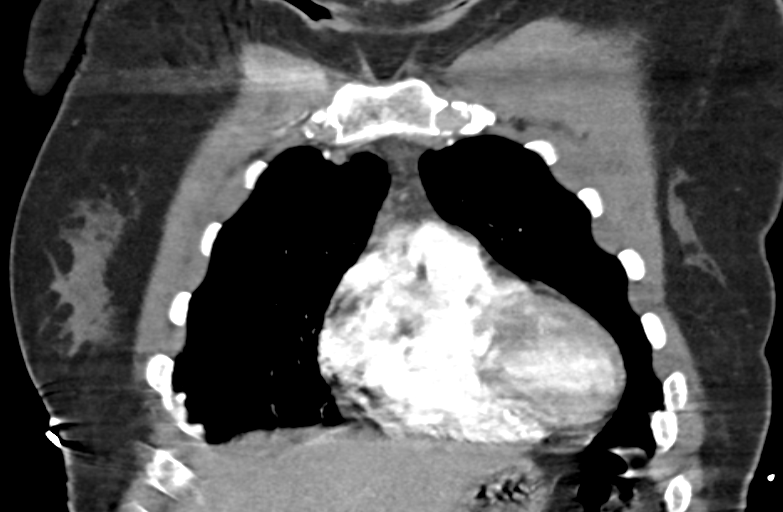
[im 54/107  soft-tissue]
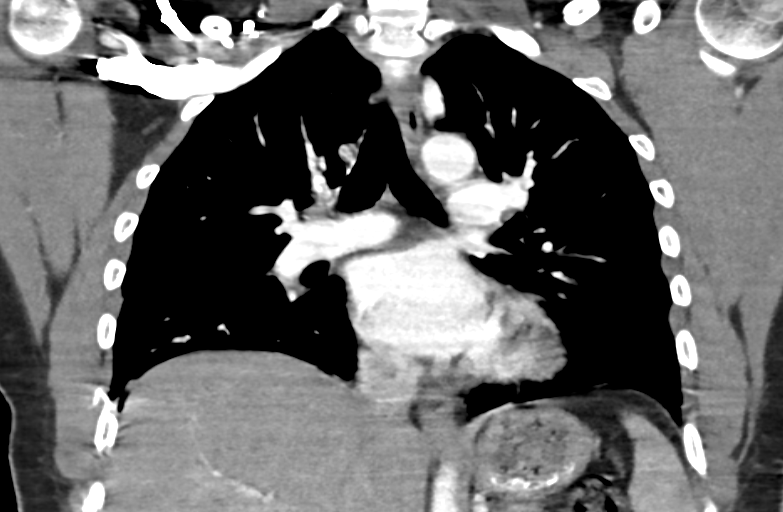
[im 80/107  soft-tissue]
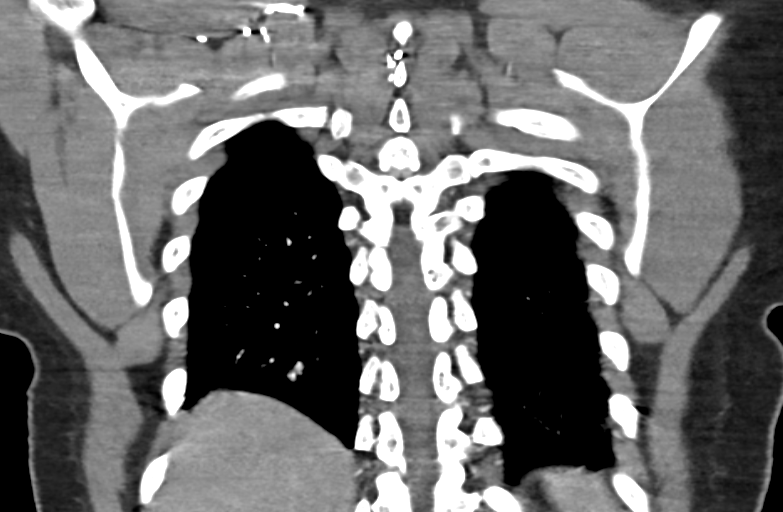

[18 of 46 positions shown; findings below may reference images not displayed]

FINDINGS: Aorta and branch vessels are within normal limits. There is no
axillary, mediastinal, or hilar adenopathy. Technically adequate
study without pulmonary embolism. Respiratory motion artifact mildly
degrades the study. Dependent atelectasis is present in the lungs.
No airspace disease. Bones appear within normal limits.

Review of the MIP images confirms the above findings. Central
airways appear normal.
IMPRESSION: Negative CTA chest. Negative for pulmonary embolism or acute aortic
abnormality.

## 2016-01-13 IMAGING — US US OB TRANSVAGINAL
1 series · 13 of 28 positions shown · non-contrast
Comparison: 05/22/2013

CLINICAL DATA: History of ectopic pregnancy treated with
methotrexate. Pain.

EXAM:
OBSTETRIC <14 WK US AND TRANSVAGINAL OB US
TECHNIQUE: Both transabdominal and transvaginal ultrasound examinations were
performed for complete evaluation of the gestation as well as the
maternal uterus, adnexal regions, and pelvic cul-de-sac.
Transvaginal technique was performed to assess early pregnancy.

[Series 1: us ob comp less 14 wks · 58 acquisitions, 13 frames shown]
[im 3/58]
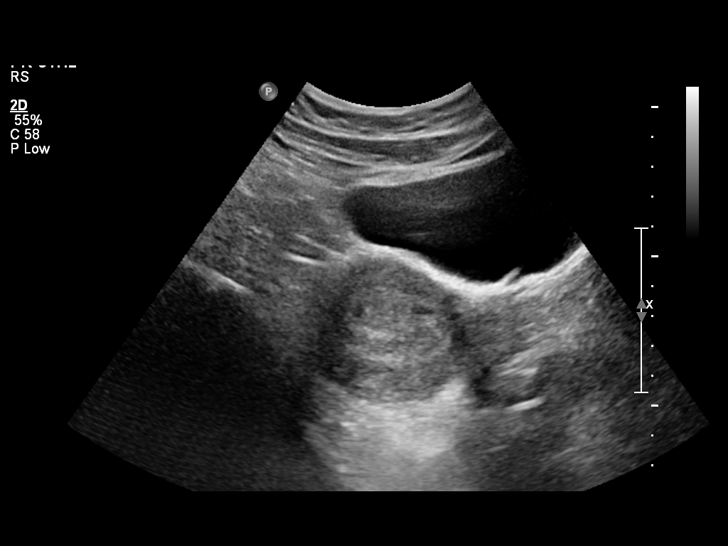
[im 7/58]
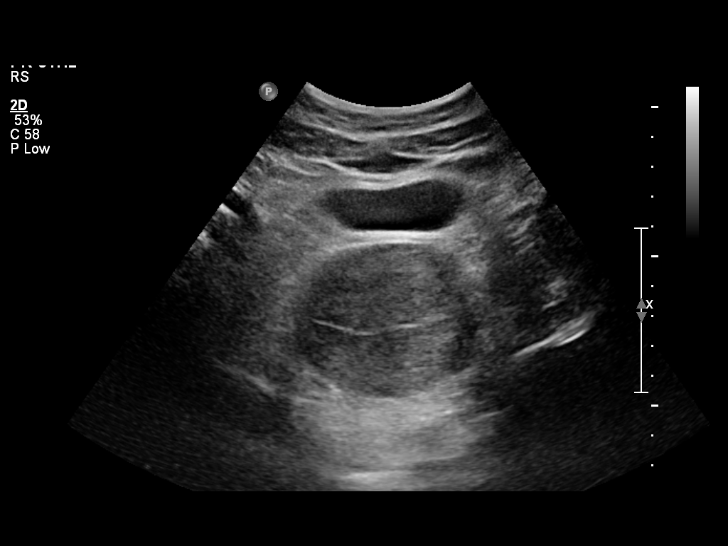
[im 11/58]
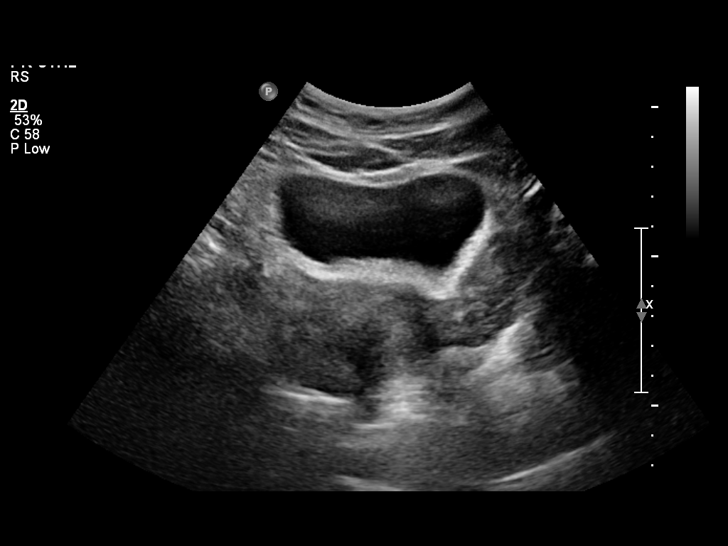
[im 15/58]
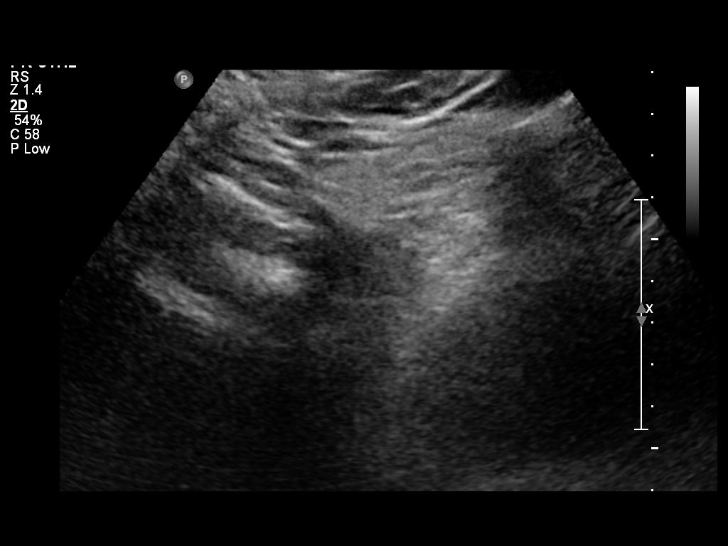
[im 20/58]
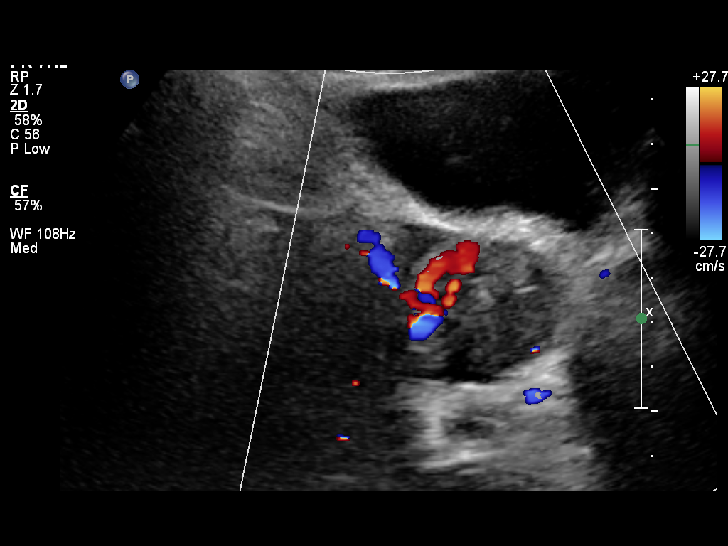
[im 24/58]
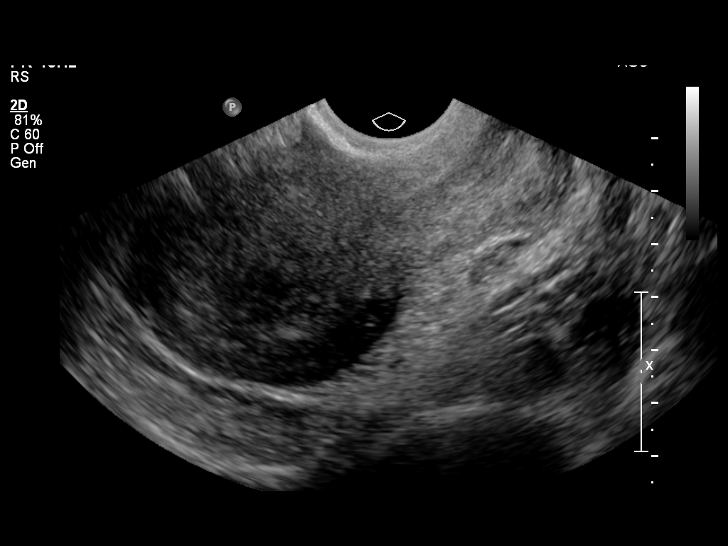
[im 30/58]
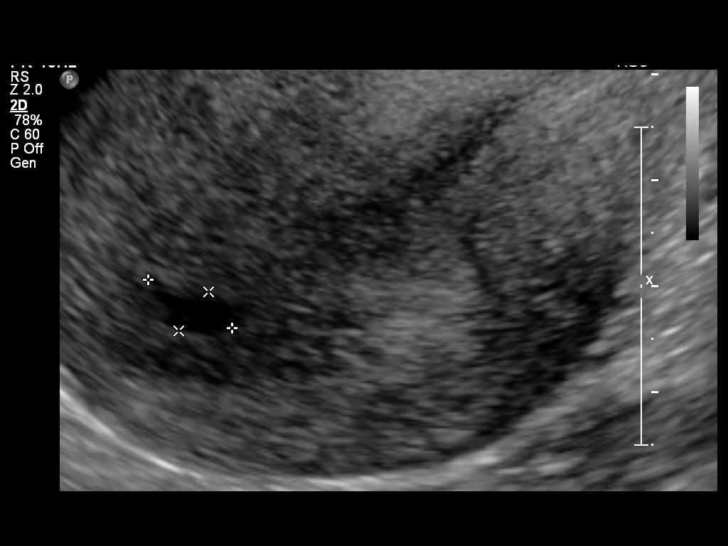
[im 34/58]
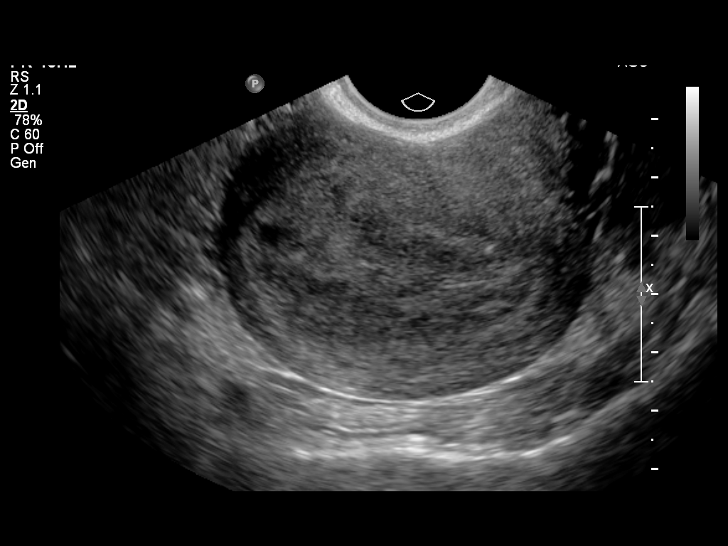
[im 39/58]
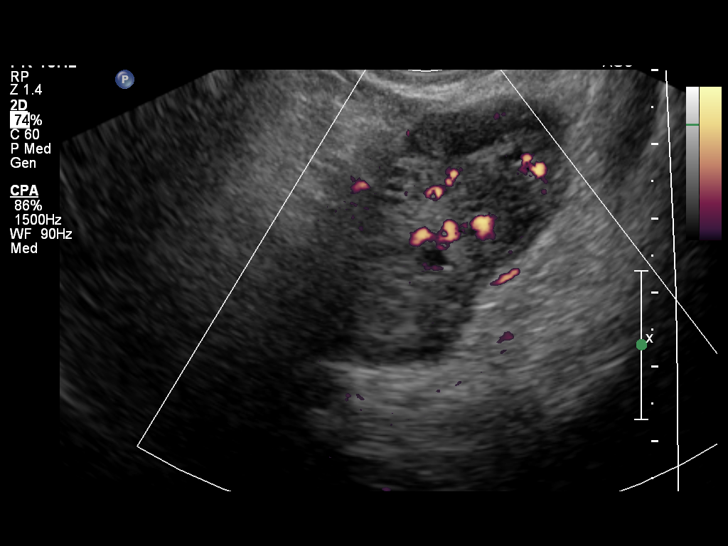
[im 43/58]
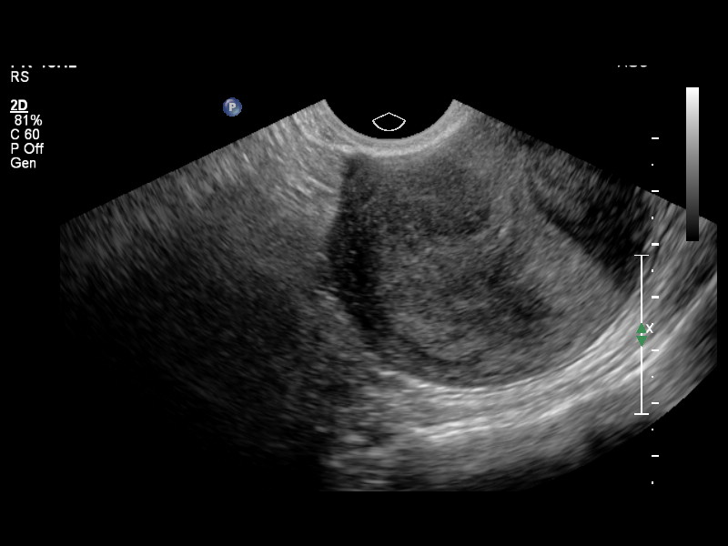
[im 47/58]
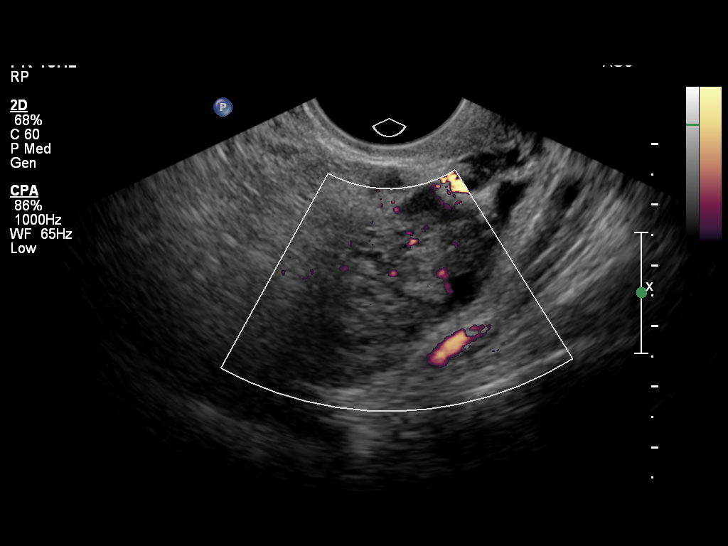
[im 51/58]
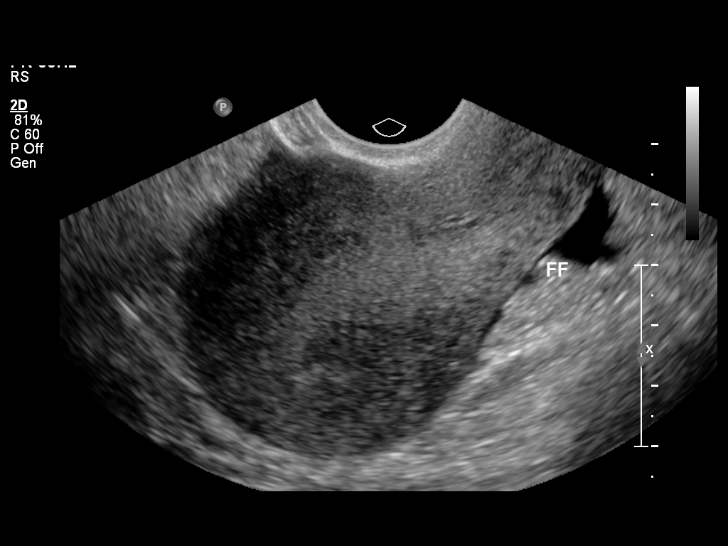
[im 55/58]
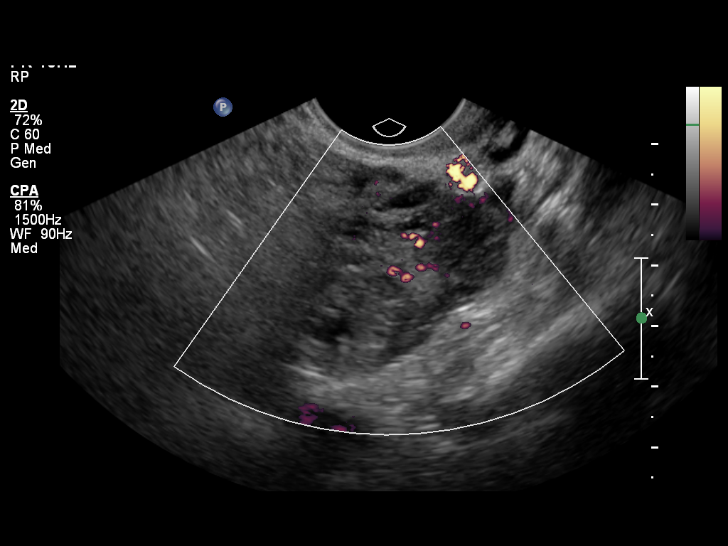

[13 of 28 positions shown; findings below may reference images not displayed]

FINDINGS: Intrauterine gestational sac: Small cystic structure in the right
upper uterine segment, which may reflect gestational sac or pseudo
gestational sac.

Yolk sac:  Not visualize

Embryo:  No

Questionable gestational sac:  7.3  mm   5 w   2  d

Maternal uterus/adnexae: No uterine/endometrial hemorrhage. No
uterine mass. Cervix is closed.

Normal right ovary.

Left ovary is somewhat heterogeneous and prominent measuring 4.1 cm
x 2.2 cm by 3.1 cm.

Small amount pelvic free fluid.
IMPRESSION: 1. No convincing ectopic pregnancy.
2. Small cystic structure in the right upper uterine segment which
could reflect a gestational sac or pseudo sac. It is mean diameter
7.3 mm which would suggest a 5 week, 2 day gestation. No embryo or
yolk sac seen.
3. Small amount pelvic free fluid.
4. No other abnormalities.

## 2016-07-04 ENCOUNTER — Encounter (HOSPITAL_COMMUNITY): Payer: Self-pay | Admitting: Emergency Medicine

## 2016-07-04 ENCOUNTER — Emergency Department (HOSPITAL_COMMUNITY)
Admission: EM | Admit: 2016-07-04 | Discharge: 2016-07-04 | Disposition: A | Discharge status: Home / Self Care | Payer: BLUE CROSS/BLUE SHIELD | Attending: Emergency Medicine | Admitting: Emergency Medicine

## 2016-07-04 ENCOUNTER — Emergency Department (HOSPITAL_COMMUNITY): Payer: BLUE CROSS/BLUE SHIELD

## 2016-07-04 DIAGNOSIS — S91312A Laceration without foreign body, left foot, initial encounter: Secondary | ICD-10-CM

## 2016-07-04 DIAGNOSIS — Y929 Unspecified place or not applicable: Secondary | ICD-10-CM | POA: Diagnosis not present

## 2016-07-04 DIAGNOSIS — W268XXA Contact with other sharp object(s), not elsewhere classified, initial encounter: Secondary | ICD-10-CM | POA: Insufficient documentation

## 2016-07-04 DIAGNOSIS — Z87891 Personal history of nicotine dependence: Secondary | ICD-10-CM | POA: Insufficient documentation

## 2016-07-04 DIAGNOSIS — Y939 Activity, unspecified: Secondary | ICD-10-CM | POA: Diagnosis not present

## 2016-07-04 DIAGNOSIS — Y999 Unspecified external cause status: Secondary | ICD-10-CM | POA: Insufficient documentation

## 2016-07-04 NOTE — ED Triage Notes (Signed)
Patient broke a porcelain dish and was picking up the pieces barefooted. Patient stepped on a piece and cut herself.

## 2016-07-04 NOTE — ED Notes (Signed)
Pt has small cuts on the bottom of the left foot from stepping on glass. Bleeding is controlled.

## 2016-07-04 NOTE — ED Provider Notes (Signed)
[facility] Provider Note   CSN: [id number] Arrival date & time: 07/04/16  2231     History   Chief Complaint Chief Complaint  Patient presents with  . Extremity Laceration    HPI Jackie Faulkner is a 36 y.o. female.  HPI   36 year old female presents for evaluation of L foot laceration. Patient reports she accidentally broke a porcelain dish and hr ago, and while in the process of cleaning up the broken pieces she accidentally cut her L foot.  She report moderate sharp pain and bleeding in her L foot.  She is UTD with tetanus. No numbness, no other complaints.  Past Medical History:  Diagnosis Date  . Anxiety   . Chest pain, unspecified   . Ectopic pregnancy June 2015   methotrexate  . Eczema   . Goiter, unspecified   . Tachycardia     Patient Active Problem List   Diagnosis Date Noted  . History of recurrent miscarriages, not currently pregnant 06/07/2014  . Tachycardia 04/27/2013  . GOITER, UNSPECIFIED 06/07/2009    Past Surgical History:  Procedure Laterality Date  . BILATERAL SALPINGECTOMY Bilateral 10/12/2013   Procedure: LEFT  SALPINGECTOMY;  Surgeon: Leslie Andrea, MD;  Location: [facility];  Service: Gynecology;  Laterality: Bilateral;  . DILATION AND CURETTAGE OF UTERUS N/A 10/12/2013   Procedure: SUCTION DILATATION AND CURETTAGE;  Surgeon: Leslie Andrea, MD;  Location: [facility];  Service: Gynecology;  Laterality: N/A;  . EXTERNAL EAR SURGERY    . LAPAROSCOPY N/A 10/12/2013   Procedure: LAPAROSCOPY DIAGNOSTIC;  Surgeon: Leslie Andrea, MD;  Location: [facility];  Service: Gynecology;  Laterality: N/A;  patient is in MAU  . TONSILLECTOMY      OB History    Gravida Para Term Preterm AB Living   3       2 0   SAB TAB Ectopic Multiple Live Births   1   1           Home Medications    Prior to Admission medications   Medication Sig Start Date End Date Taking? Authorizing Provider  clindamycin (CLEOCIN) 300 MG capsule Take 1 capsule (300 mg  total) by mouth 3 (three) times daily. 07/21/14   Hayden Rasmussen, NP  dexlansoprazole (DEXILANT) 60 MG capsule Take 60 mg by mouth daily.    Historical Provider, MD  dicyclomine (BENTYL) 20 MG tablet Take 1 tablet (20 mg total) by mouth 3 (three) times daily before meals. 07/08/15   Arthor Captain, PA-C  docusate sodium (COLACE) 100 MG capsule Take 200 mg by mouth 2 (two) times daily.    Historical Provider, MD  fluconazole (DIFLUCAN) 150 MG tablet Take 1 tablet (150 mg total) by mouth once. 06/07/14   Adam Phenix, MD  HYDROcodone-acetaminophen (NORCO) 7.5-325 MG per tablet Take 1 tablet by mouth every 4 (four) hours as needed. 07/21/14   Hayden Rasmussen, NP  Iron-FA-B Cmp-C-Biot-Probiotic (FUSION PLUS PO) Take 1 capsule by mouth daily.    Historical Provider, MD  naproxen (NAPROSYN) 500 MG tablet Take 1 tablet (500 mg total) by mouth 2 (two) times daily with a meal. Patient not taking: Reported on 07/08/2015 05/25/15   Tharon Aquas, PA  ondansetron (ZOFRAN) 4 MG tablet Take 1 tablet (4 mg total) by mouth every 8 (eight) hours as needed for nausea or vomiting. 07/08/15   Arthor Captain, PA-C    Family History Family History  Problem Relation Age of Onset  .  Anesthesia problems Neg Hx   . Hypotension Neg Hx   . Pseudochol deficiency Neg Hx   . Malignant hyperthermia Neg Hx   . Diabetes Mother   . Hypertension Mother   . Hypertension Father   . Heart disease Father     Social History Social History  Substance Use Topics  . Smoking status: Former Smoker    Quit date: 05/05/2005  . Smokeless tobacco: Never Used  . Alcohol use Yes     Comment: occasional.     Allergies   Amoxicillin; Penicillins; and Metronidazole   Review of Systems Review of Systems  Constitutional: Negative for fever.  Skin: Positive for wound.  Neurological: Negative for numbness.     Physical Exam Updated Vital Signs BP 115/85 (BP Location: Left Arm)   Pulse 76   Temp 97.9 F (36.6 C) (Oral)   Resp 18   Ht 5'  4" (1.626 m)   Wt 92.4 kg   LMP 06/15/2016   SpO2 100%   BMI 34.95 kg/m   Physical Exam  Constitutional: She appears well-developed and well-nourished. No distress.  HENT:  Head: Atraumatic.  Eyes: Conjunctivae are normal.  Neck: Neck supple.  Musculoskeletal: She exhibits tenderness (L foot: a 3mm superficial skin tear noted to lateral mid foot without fb noted, not actively bleeding.).  Neurological: She is alert.  Skin: No rash noted.  Psychiatric: She has a normal mood and affect.  Nursing note and vitals reviewed.    ED Treatments / Results  Labs (all labs ordered are listed, but only abnormal results are displayed) Labs Reviewed - No data to display  EKG  EKG Interpretation None       Radiology Dg Foot Complete Left  Result Date: 07/04/2016 CLINICAL DATA:  Left foot pain after injury. Stepped on glass, laceration. EXAM: LEFT FOOT - COMPLETE 3+ VIEW COMPARISON:  None. FINDINGS: There is no evidence of fracture or dislocation. There is no evidence of arthropathy or other focal bone abnormality. No radiopaque foreign body. Site of clinical laceration not confidently visualized. No tracking soft tissue air. IMPRESSION: No fracture or radiopaque foreign body. Unremarkable radiographs of the left foot. Electronically Signed   By: Rubye OaksMelanie  Ehinger M.D.   On: 07/04/2016 23:17    Procedures Procedures (including critical care time)  Medications Ordered in ED Medications - No data to display   Initial Impression / Assessment and Plan / ED Course  I have reviewed the triage vital signs and the nursing notes.  Pertinent labs & imaging results that were available during my care of the patient were reviewed by me and considered in my medical decision making (see chart for details).     BP 115/85 (BP Location: Left Arm)   Pulse 76   Temp 97.9 F (36.6 C) (Oral)   Resp 18   Ht 5\' 4"  (1.626 m)   Wt 92.4 kg   LMP 06/15/2016   SpO2 100%   BMI 34.95 kg/m    Final  Clinical Impressions(s) / ED Diagnoses   Final diagnoses:  Laceration of left foot, initial encounter    New Prescriptions New Prescriptions   No medications on file   11:34 PM Pt suffered a small skin tear to L foot from a broken piece of porcelain.  Bleeding is controlled. Skin is cleansed and carefully examined. Xray without fb noted. She is UTD with tetanus. Wound care instruction given.    Fayrene HelperBowie Evelynn Hench, PA-C 07/04/16 2335    Melene Planan Floyd, DO 07/04/16  2349  

## 2017-05-10 ENCOUNTER — Other Ambulatory Visit: Payer: Self-pay

## 2017-05-10 ENCOUNTER — Ambulatory Visit (HOSPITAL_COMMUNITY)
Admission: EM | Admit: 2017-05-10 | Discharge: 2017-05-10 | Disposition: A | Discharge status: Home / Self Care | Payer: BLUE CROSS/BLUE SHIELD | Attending: Physician Assistant | Admitting: Physician Assistant

## 2017-05-10 ENCOUNTER — Encounter (HOSPITAL_COMMUNITY): Payer: Self-pay | Admitting: *Deleted

## 2017-05-10 DIAGNOSIS — J069 Acute upper respiratory infection, unspecified: Secondary | ICD-10-CM | POA: Diagnosis not present

## 2017-05-10 MED ORDER — CETIRIZINE-PSEUDOEPHEDRINE ER 5-120 MG PO TB12: 1.0000 | ORAL_TABLET | Freq: Two times a day (BID) | ORAL | 0 refills | Status: AC

## 2017-05-10 NOTE — Discharge Instructions (Signed)
Stay the course

## 2017-05-10 NOTE — ED Triage Notes (Signed)
Sore throat, congestions, started 4 days ago,

## 2017-05-10 NOTE — ED Provider Notes (Signed)
05/10/2017 1:47 PM   DOB: 1981/04/04 / MRN: [id number]  SUBJECTIVE:  Jackie Faulkner is a 37 y.o. female presenting for nasal congestion and sore throat.  Symptoms started five days ago.  She has tried multiple therapies and is not feeling resolved.  No fever, chest pain, SOB, DOE.   She is allergic to amoxicillin; penicillins; and metronidazole.   She  has a past medical history of Anxiety, Chest pain, unspecified, Ectopic pregnancy (June 2015), Eczema, Goiter, unspecified, and Tachycardia.    She  reports that she quit smoking about 12 years ago. she has never used smokeless tobacco. She reports that she drinks alcohol. She reports that she does not use drugs. She  reports that she currently engages in sexual activity. She reports using the following method of birth control/protection: Condom. The patient  has a past surgical history that includes External ear surgery; Tonsillectomy; laparoscopy (N/A, 10/12/2013); Bilateral salpingectomy (Bilateral, 10/12/2013); and Dilation and curettage of uterus (N/A, 10/12/2013).  Her family history includes Diabetes in her mother; Heart disease in her father; Hypertension in her father and mother.  Review of Systems  Constitutional: Negative for chills, diaphoresis and fever.  Respiratory: Negative for shortness of breath.   Cardiovascular: Negative for chest pain, orthopnea and leg swelling.  Gastrointestinal: Negative for abdominal pain, blood in stool, constipation, diarrhea, heartburn, melena, nausea and vomiting.  Genitourinary: Negative for flank pain.  Skin: Negative for rash.  Neurological: Negative for dizziness.    OBJECTIVE:  BP 110/68 (BP Location: Left Arm)   Pulse 81   Temp 98.6 F (37 C)   LMP 04/25/2017 (Exact Date)   SpO2 100%   Physical Exam  Constitutional: She is active.  Non-toxic appearance.  HENT:  Right Ear: Hearing, tympanic membrane, external ear and ear canal normal.  Left Ear: Hearing, tympanic membrane, external ear  and ear canal normal.  Nose: Nose normal. Right sinus exhibits no maxillary sinus tenderness and no frontal sinus tenderness. Left sinus exhibits no maxillary sinus tenderness and no frontal sinus tenderness.  Mouth/Throat: Uvula is midline, oropharynx is clear and moist and mucous membranes are normal. Mucous membranes are not dry. No oropharyngeal exudate, posterior oropharyngeal edema or tonsillar abscesses.  Cardiovascular: Normal rate, regular rhythm, S1 normal, S2 normal, normal heart sounds and intact distal pulses. Exam reveals no gallop, no friction rub and no decreased pulses.  No murmur heard. Pulmonary/Chest: Effort normal. No stridor. No tachypnea. No respiratory distress. She has no wheezes. She has no rales.  Abdominal: She exhibits no distension.  Musculoskeletal: She exhibits no edema.  Lymphadenopathy:       Head (right side): No submandibular and no tonsillar adenopathy present.       Head (left side): No submandibular and no tonsillar adenopathy present.    She has no cervical adenopathy.  Neurological: She is alert.  Skin: Skin is warm and dry. She is not diaphoretic. No pallor.    No results found for this or any previous visit (from the past 72 hour(s)).  No results found.  ASSESSMENT AND PLAN:  Viral upper respiratory tract infection - No red flags in HPI or exam.  Sudaphed and advised that she take her meloxicam previously prescribed.      The patient is advised to call or return to clinic if she does not see an improvement in symptoms, or to seek the care of the closest emergency department if she worsens with the above plan.   Deliah Boston, MHS, PA-C 05/10/2017 1:47 PM  Ofilia NeasClark, Michael L, PA-C 05/10/17 1347

## 2017-12-14 ENCOUNTER — Emergency Department (HOSPITAL_COMMUNITY)
Admission: EM | Admit: 2017-12-14 | Discharge: 2017-12-14 | Disposition: A | Discharge status: Home / Self Care | Payer: BLUE CROSS/BLUE SHIELD | Attending: Emergency Medicine | Admitting: Emergency Medicine

## 2017-12-14 ENCOUNTER — Encounter (HOSPITAL_COMMUNITY): Payer: Self-pay | Admitting: Nurse Practitioner

## 2017-12-14 DIAGNOSIS — Z87891 Personal history of nicotine dependence: Secondary | ICD-10-CM | POA: Insufficient documentation

## 2017-12-14 DIAGNOSIS — K602 Anal fissure, unspecified: Secondary | ICD-10-CM | POA: Diagnosis not present

## 2017-12-14 DIAGNOSIS — Z79899 Other long term (current) drug therapy: Secondary | ICD-10-CM | POA: Insufficient documentation

## 2017-12-14 DIAGNOSIS — K6289 Other specified diseases of anus and rectum: Secondary | ICD-10-CM | POA: Diagnosis present

## 2017-12-14 DIAGNOSIS — E039 Hypothyroidism, unspecified: Secondary | ICD-10-CM | POA: Diagnosis not present

## 2017-12-14 DIAGNOSIS — N760 Acute vaginitis: Secondary | ICD-10-CM | POA: Diagnosis not present

## 2017-12-14 DIAGNOSIS — K5909 Other constipation: Secondary | ICD-10-CM | POA: Diagnosis not present

## 2017-12-14 DIAGNOSIS — B9689 Other specified bacterial agents as the cause of diseases classified elsewhere: Secondary | ICD-10-CM

## 2017-12-14 LAB — WET PREP, GENITAL
SPERM: NONE SEEN
TRICH WET PREP: NONE SEEN
Yeast Wet Prep HPF POC: NONE SEEN

## 2017-12-14 LAB — TSH: TSH: 1.843 u[IU]/mL (ref 0.350–4.500)

## 2017-12-14 MED ORDER — CLINDAMYCIN PHOSPHATE 2 % VA CREA: 1.0000 | TOPICAL_CREAM | Freq: Every day | VAGINAL | 0 refills | Status: DC

## 2017-12-14 MED ORDER — CLINDAMYCIN HCL 300 MG PO CAPS: 300.0000 mg | ORAL_CAPSULE | Freq: Two times a day (BID) | ORAL | 0 refills | Status: DC

## 2017-12-14 MED ORDER — LIDOCAINE (ANORECTAL) 5 % EX CREA: TOPICAL_CREAM | CUTANEOUS | 0 refills | Status: DC

## 2017-12-14 NOTE — ED Triage Notes (Signed)
Pt is c/o diffuse abdominal pain that she is associating to constipation and her hx of IBS, states she has tried her prescribed laxatives and enema without getting relief, she also adds that she may have hemorrhoid secondary to try too hard to go.

## 2017-12-14 NOTE — ED Provider Notes (Addendum)
[facility] Provider Note: Lowella DellJ. Lane Britteny Fiebelkorn, MD, FACEP  CSN: [id number] MRN: [id number] ARRIVAL: 12/14/17 at 0002 ROOM: [facility]   CHIEF COMPLAINT  Rectal Pain   HISTORY OF PRESENT ILLNESS  12/14/17 12:40 AM Jackie Faulkner is a 37 y.o. female with a history of chronic constipation.  The constipation tends to be intermittent with difficulty moving bowels for a week alternating with a week of relatively normal bowel movements.  She states she may be hypothyroid.  She was told she was hypothyroid in 2012 but has not followed up on this.  In addition to being currently constipated, despite taking magnesium citrate, she is having severe pain in her rectum when she moves her bowels or attempts to move her bowels.  She suspects she has a hemorrhoid.   Past Medical History:  Diagnosis Date  . Anxiety   . Chest pain, unspecified   . Ectopic pregnancy June 2015   methotrexate  . Eczema   . Goiter, unspecified   . Tachycardia     Past Surgical History:  Procedure Laterality Date  . BILATERAL SALPINGECTOMY Bilateral 10/12/2013   Procedure: LEFT  SALPINGECTOMY;  Surgeon: Leslie AndreaJames E Tomblin II, MD;  Location: [facility];  Service: Gynecology;  Laterality: Bilateral;  . DILATION AND CURETTAGE OF UTERUS N/A 10/12/2013   Procedure: SUCTION DILATATION AND CURETTAGE;  Surgeon: Leslie AndreaJames E Tomblin II, MD;  Location: [facility];  Service: Gynecology;  Laterality: N/A;  . EXTERNAL EAR SURGERY    . LAPAROSCOPY N/A 10/12/2013   Procedure: LAPAROSCOPY DIAGNOSTIC;  Surgeon: Leslie AndreaJames E Tomblin II, MD;  Location: [facility];  Service: Gynecology;  Laterality: N/A;  patient is in MAU  . TONSILLECTOMY      Family History  Problem Relation Age of Onset  . Diabetes Mother   . Hypertension Mother   . Hypertension Father   . Heart disease Father   . Anesthesia problems Neg Hx   . Hypotension Neg Hx   . Pseudochol deficiency Neg Hx   . Malignant hyperthermia Neg Hx     Social History   Tobacco Use  . Smoking  status: Former Smoker    Last attempt to quit: 05/05/2005    Years since quitting: 12.6  . Smokeless tobacco: Never Used  Substance Use Topics  . Alcohol use: Yes    Comment: occasional.  . Drug use: No    Prior to Admission medications   Medication Sig Start Date End Date Taking? Authorizing Provider  dexlansoprazole (DEXILANT) 60 MG capsule Take 60 mg by mouth daily.    [provider]  docusate sodium (COLACE) 100 MG capsule Take 200 mg by mouth 2 (two) times daily.    [provider]  Iron-FA-B Cmp-C-Biot-Probiotic (FUSION PLUS PO) Take 1 capsule by mouth daily.    [provider]  Lidocaine, Anorectal, 5 % CREA Apply to perianal region as needed for pain. 12/14/17   Jeneal Vogl, MD    Allergies Amoxicillin; Penicillins; and Metronidazole   REVIEW OF SYSTEMS  Negative except as noted here or in the History of Present Illness.   PHYSICAL EXAMINATION  Initial Vital Signs Blood pressure 130/90, pulse 84, temperature 98.6 F (37 C), temperature source Oral, resp. rate 18, SpO2 100 %.  Examination General: Well-developed, well-nourished female in no acute distress; appearance consistent with age of record HENT: normocephalic; atraumatic Eyes: pupils equal, round and reactive to light; extraocular muscles intact Neck: supple Heart: regular rate and rhythm Lungs: clear to auscultation bilaterally Abdomen: soft; nondistended;  nontender; no masses or hepatosplenomegaly; bowel sounds present Rectal: Anterior midline fissure; no hemorrhoids Extremities: No deformity; full range of motion; pulses normal Neurologic: Awake, alert and oriented; motor function intact in all extremities and symmetric; no facial droop Skin: Warm and dry Psychiatric: Normal mood and affect   RESULTS  Summary of this visit's results, reviewed by myself:   EKG Interpretation  Date/Time:    Ventricular Rate:    PR Interval:    QRS Duration:   QT Interval:    QTC  Calculation:   R Axis:     Text Interpretation:        Laboratory Studies: Results for orders placed or performed during the hospital encounter of 12/14/17 (from the past 24 hour(s))  TSH     Status: None   Collection Time: 12/14/17  1:11 AM  Result Value Ref Range   TSH 1.843 0.350 - 4.500 uIU/mL  Wet prep, genital     Status: Abnormal   Collection Time: 12/14/17  1:20 AM  Result Value Ref Range   Yeast Wet Prep HPF POC NONE SEEN NONE SEEN   Trich, Wet Prep NONE SEEN NONE SEEN   Clue Cells Wet Prep HPF POC PRESENT (A) NONE SEEN   WBC, Wet Prep HPF POC MODERATE (A) NONE SEEN   Sperm NONE SEEN    Imaging Studies: No results found.  ED COURSE and MDM  Nursing notes and initial vitals signs, including pulse oximetry, reviewed.  Vitals:   12/14/17 0006  BP: 130/90  Pulse: 84  Resp: 18  Temp: 98.6 F (37 C)  TempSrc: Oral  SpO2: 100%   Patient advised of anal seizure.  We will treat with 5% lidocaine rectal cream.  She has a primary care physician. We will check a TSH and have her primary follow-up on the results.  She also has a gastroenterologist but has not followed up due to financial limitations.  2:48 AM At discharge patient complained of vaginal itching.  Wet prep shows clue cells consistent with bacterial vaginosis.  TSH is within normal limits.  PROCEDURES    ED DIAGNOSES     ICD-10-CM   1. Anal fissure K60.2   2. Chronic constipation K59.09   3. Bacterial vaginosis N76.0    B96.89        Kenyetta Wimbish, Jonny RuizJohn, MD 12/14/17 0055    Paula LibraMolpus, Krystal Delduca, MD 12/14/17 [phone]

## 2018-07-01 ENCOUNTER — Other Ambulatory Visit: Payer: Self-pay

## 2018-07-01 ENCOUNTER — Emergency Department (HOSPITAL_COMMUNITY)
Admission: EM | Admit: 2018-07-01 | Discharge: 2018-07-01 | Disposition: A | Discharge status: Home / Self Care | Payer: BLUE CROSS/BLUE SHIELD | Attending: Emergency Medicine | Admitting: Emergency Medicine

## 2018-07-01 ENCOUNTER — Encounter (HOSPITAL_COMMUNITY): Payer: Self-pay | Admitting: Emergency Medicine

## 2018-07-01 DIAGNOSIS — Z87891 Personal history of nicotine dependence: Secondary | ICD-10-CM | POA: Diagnosis not present

## 2018-07-01 DIAGNOSIS — Z79899 Other long term (current) drug therapy: Secondary | ICD-10-CM | POA: Diagnosis not present

## 2018-07-01 DIAGNOSIS — J069 Acute upper respiratory infection, unspecified: Secondary | ICD-10-CM | POA: Diagnosis not present

## 2018-07-01 DIAGNOSIS — J029 Acute pharyngitis, unspecified: Secondary | ICD-10-CM | POA: Diagnosis present

## 2018-07-01 HISTORY — DX: Irritable bowel syndrome, unspecified: K58.9

## 2018-07-01 HISTORY — DX: Gastro-esophageal reflux disease without esophagitis: K21.9

## 2018-07-01 LAB — GROUP A STREP BY PCR: GROUP A STREP BY PCR: NOT DETECTED

## 2018-07-01 MED ORDER — ACETAMINOPHEN 325 MG PO TABS
650.0000 mg | ORAL_TABLET | Freq: Once | ORAL | Status: AC
  Administered 2018-07-01: 650 mg via ORAL
  Filled 2018-07-01: qty 2

## 2018-07-01 NOTE — ED Triage Notes (Signed)
Pt reports took Theraflu before went to bed last night but slept with balcony door open. Today woke up with sore throat, chills, body aches, yellow phlegm stuck in throat.

## 2018-07-01 NOTE — ED Provider Notes (Signed)
[facility]-EMERGENCY DEPT Provider Note   CSN: [id number] Arrival date & time: 07/01/18  1832    History   Chief Complaint Chief Complaint  Patient presents with  . Sore Throat  . Chills  . Generalized Body Aches    HPI Jackie Faulkner is a 38 y.o. female.     The history is provided by the patient and medical records. No language interpreter was used.  Sore Throat  Pertinent negatives include no chest pain, no abdominal pain and no shortness of breath.   Jackie Faulkner is a 38 y.o. female  with a PMH of tachycardia, GERD, IBS, eczema and anxiety who presents to the Emergency Department complaining of sore throat, generalized body aches, chills since yesterday morning.  The night before her symptoms started, she accidentally fell asleep with her balcony door open.  She believes this exacerbated her symptoms.  She denies any cough, but does state that she coughed once and got some yellow phlegm up.  She feels as if phlegm is stuck in her throat.  Nuys any trouble breathing.  Has been taking fluids without any problem.  She took TheraFlu once with no improvement.  No medications taken prior to arrival for symptoms.  Did not believe that she was having fever at home.  Does have a temperature of 100.3 in the ER today and was experiencing chills at home.  Did not get a flu shot.  Denies sick contacts, but does work at the [employer], so I suspect that she has multiple sick contacts day in and day out at work.  Past Medical History:  Diagnosis Date  . Acid reflux   . Anxiety   . Chest pain, unspecified   . Ectopic pregnancy June 2015   methotrexate  . Eczema   . Goiter, unspecified   . IBS (irritable bowel syndrome)   . Tachycardia     Patient Active Problem List   Diagnosis Date Noted  . History of recurrent miscarriages, not currently pregnant 06/07/2014  . Tachycardia 04/27/2013  . GOITER, UNSPECIFIED 06/07/2009    Past Surgical  History:  Procedure Laterality Date  . BILATERAL SALPINGECTOMY Bilateral 10/12/2013   Procedure: LEFT  SALPINGECTOMY;  Surgeon: Leslie Andrea, MD;  Location: [facility];  Service: Gynecology;  Laterality: Bilateral;  . DILATION AND CURETTAGE OF UTERUS N/A 10/12/2013   Procedure: SUCTION DILATATION AND CURETTAGE;  Surgeon: Leslie Andrea, MD;  Location: [facility];  Service: Gynecology;  Laterality: N/A;  . EXTERNAL EAR SURGERY    . LAPAROSCOPY N/A 10/12/2013   Procedure: LAPAROSCOPY DIAGNOSTIC;  Surgeon: Leslie Andrea, MD;  Location: [facility];  Service: Gynecology;  Laterality: N/A;  patient is in MAU  . TONSILLECTOMY       OB History    Gravida  3   Para      Term      Preterm      AB  2   Living  0     SAB  1   TAB      Ectopic  1   Multiple      Live Births               Home Medications    Prior to Admission medications   Medication Sig Start Date End Date Taking? Authorizing Provider  clindamycin (CLEOCIN) 300 MG capsule Take 1 capsule (300 mg total) by mouth 2 (two) times daily. X 7 days 12/14/17  Molpus, John, MD  dexlansoprazole (DEXILANT) 60 MG capsule Take 60 mg by mouth daily.    [provider]  docusate sodium (COLACE) 100 MG capsule Take 200 mg by mouth 2 (two) times daily.    [provider]  Iron-FA-B Cmp-C-Biot-Probiotic (FUSION PLUS PO) Take 1 capsule by mouth daily.    [provider]  Lidocaine, Anorectal, 5 % CREA Apply to perianal region as needed for pain. 12/14/17   Molpus, John, MD    Family History Family History  Problem Relation Age of Onset  . Diabetes Mother   . Hypertension Mother   . Hypertension Father   . Heart disease Father   . Anesthesia problems Neg Hx   . Hypotension Neg Hx   . Pseudochol deficiency Neg Hx   . Malignant hyperthermia Neg Hx     Social History Social History   Tobacco Use  . Smoking status: Former Smoker    Last attempt to quit: 05/05/2005    Years since quitting:  13.1  . Smokeless tobacco: Never Used  Substance Use Topics  . Alcohol use: Yes    Comment: occasional.  . Drug use: No     Allergies   Amoxicillin; Penicillins; and Metronidazole   Review of Systems Review of Systems  Constitutional: Positive for chills.  HENT: Positive for congestion and sore throat. Negative for trouble swallowing.   Respiratory: Negative for cough and shortness of breath.   Cardiovascular: Negative for chest pain.  Gastrointestinal: Negative for abdominal pain, diarrhea, nausea and vomiting.  Musculoskeletal: Positive for myalgias. Negative for back pain and neck pain.  Skin: Negative for rash.  Neurological: Negative for weakness and numbness.     Physical Exam Updated Vital Signs BP 122/66 (BP Location: Right Arm)   Pulse 91   Temp 100.3 F (37.9 C) (Oral)   Resp 16   LMP 06/17/2018   SpO2 100%   Physical Exam Vitals signs and nursing note reviewed.  Constitutional:      General: She is not in acute distress.    Appearance: She is well-developed.  HENT:     Head: Normocephalic and atraumatic.     Mouth/Throat:     Mouth: Mucous membranes are moist.     Pharynx: Posterior oropharyngeal erythema present. No oropharyngeal exudate.  Neck:     Musculoskeletal: Neck supple.  Cardiovascular:     Heart sounds: Normal heart sounds. No murmur.     Comments: Mildly tachycardic, but regular. Pulmonary:     Effort: Pulmonary effort is normal. No respiratory distress.     Breath sounds: Normal breath sounds. No wheezing or rales.     Comments: Lungs clear to auscultation bilaterally. Musculoskeletal: Normal range of motion.  Skin:    General: Skin is warm and dry.  Neurological:     Mental Status: She is alert.      ED Treatments / Results  Labs (all labs ordered are listed, but only abnormal results are displayed) Labs Reviewed  GROUP A STREP BY PCR    EKG None  Radiology No results found.  Procedures Procedures (including  critical care time)  Medications Ordered in ED Medications  acetaminophen (TYLENOL) tablet 650 mg (650 mg Oral Given 07/01/18 2036)     Initial Impression / Assessment and Plan / ED Course  I have reviewed the triage vital signs and the nursing notes.  Pertinent labs & imaging results that were available during my care of the patient were reviewed by me and considered in  my medical decision making (see chart for details).       Jackie Faulkner is a 39 y.o. female who presents to ED for 2 days of generalized body aches, sore throat and chills..   On exam, patient is non-toxic appearing with a clear lung exam.  Temperature of 100.3.  Mild rhinorrhea and OP with erythema. Rapid strep negative.  Sxs today likely due to viral URI.Symptomatic home care instructions discussed. PCP follow up strongly encouraged if symptoms persist. Reasons to return to ER discussed. All questions answered.     Final Clinical Impressions(s) / ED Diagnoses   Final diagnoses:  Viral URI    ED Discharge Orders    None       Milta Croson, Chase Picket, PA-C 07/01/18 2133    Vanetta Mulders, MD 07/04/18 1401

## 2018-07-01 NOTE — Discharge Instructions (Signed)
It was my pleasure taking care of you today!   Alternate between Tylenol and Ibuprofen as needed for fevers, sore throat, body aches.   Call your primary care doctor on Monday to schedule a follow-up appointment if you are not feeling better.   Return to the emergency department for new or worsening symptoms, any additional concerns.

## 2018-07-03 ENCOUNTER — Encounter (HOSPITAL_COMMUNITY): Payer: Self-pay

## 2018-07-03 ENCOUNTER — Other Ambulatory Visit: Payer: Self-pay

## 2018-07-03 ENCOUNTER — Ambulatory Visit (HOSPITAL_COMMUNITY)
Admission: EM | Admit: 2018-07-03 | Discharge: 2018-07-03 | Disposition: A | Discharge status: Home / Self Care | Payer: BLUE CROSS/BLUE SHIELD | Attending: Family Medicine | Admitting: Family Medicine

## 2018-07-03 DIAGNOSIS — B9789 Other viral agents as the cause of diseases classified elsewhere: Secondary | ICD-10-CM

## 2018-07-03 DIAGNOSIS — J069 Acute upper respiratory infection, unspecified: Secondary | ICD-10-CM | POA: Insufficient documentation

## 2018-07-03 DIAGNOSIS — M545 Low back pain: Secondary | ICD-10-CM | POA: Diagnosis not present

## 2018-07-03 DIAGNOSIS — R350 Frequency of micturition: Secondary | ICD-10-CM | POA: Diagnosis not present

## 2018-07-03 LAB — POCT URINALYSIS DIP (DEVICE)
Bilirubin Urine: NEGATIVE
Glucose, UA: NEGATIVE mg/dL
HGB URINE DIPSTICK: NEGATIVE
KETONES UR: NEGATIVE mg/dL
Nitrite: NEGATIVE
PH: 7 (ref 5.0–8.0)
Protein, ur: NEGATIVE mg/dL
SPECIFIC GRAVITY, URINE: 1.02 (ref 1.005–1.030)
Urobilinogen, UA: 1 mg/dL (ref 0.0–1.0)

## 2018-07-03 MED ORDER — FLUTICASONE PROPIONATE 50 MCG/ACT NA SUSP: 2.0000 | Freq: Every day | NASAL | 0 refills | Status: AC

## 2018-07-03 MED ORDER — IPRATROPIUM BROMIDE 0.06 % NA SOLN: 2.0000 | Freq: Four times a day (QID) | NASAL | 0 refills | Status: AC

## 2018-07-03 MED ORDER — NITROFURANTOIN MONOHYD MACRO 100 MG PO CAPS: 100.0000 mg | ORAL_CAPSULE | Freq: Two times a day (BID) | ORAL | 0 refills | Status: AC

## 2018-07-03 NOTE — ED Provider Notes (Signed)
[facility]    CSN: [id number] Arrival date & time: 07/03/18  1307     History   Chief Complaint Chief Complaint  Patient presents with  . Cough    HPI Jackie Faulkner is a 38 y.o. female.   38 year old female comes in for 4 day history of URI symptoms. She has had cough, congestion, nasal congestion, sore throat, right ear pain, chills. tmax 100.3. she was seen at the ED for same symptoms and had negative strep and was told to tylenol/motrin for the symptoms. She states nasal congestion and sore throat has worsened. She took left over amoxicillin without relief.   She would also like to be checked for an UTI. States has had frequency and left lower back pain. Denies dysuria. Hematuria. Denies abdominal pain, nausea, vomiting. LMP 06/17/2018     Past Medical History:  Diagnosis Date  . Acid reflux   . Anxiety   . Chest pain, unspecified   . Ectopic pregnancy June 2015   methotrexate  . Eczema   . Goiter, unspecified   . IBS (irritable bowel syndrome)   . Tachycardia     Patient Active Problem List   Diagnosis Date Noted  . History of recurrent miscarriages, not currently pregnant 06/07/2014  . Tachycardia 04/27/2013  . GOITER, UNSPECIFIED 06/07/2009    Past Surgical History:  Procedure Laterality Date  . BILATERAL SALPINGECTOMY Bilateral 10/12/2013   Procedure: LEFT  SALPINGECTOMY;  Surgeon: Leslie Andrea, MD;  Location: [facility];  Service: Gynecology;  Laterality: Bilateral;  . DILATION AND CURETTAGE OF UTERUS N/A 10/12/2013   Procedure: SUCTION DILATATION AND CURETTAGE;  Surgeon: Leslie Andrea, MD;  Location: [facility];  Service: Gynecology;  Laterality: N/A;  . EXTERNAL EAR SURGERY    . LAPAROSCOPY N/A 10/12/2013   Procedure: LAPAROSCOPY DIAGNOSTIC;  Surgeon: Leslie Andrea, MD;  Location: [facility];  Service: Gynecology;  Laterality: N/A;  patient is in MAU  . TONSILLECTOMY      OB History    Gravida  3   Para      Term      Preterm      AB  2   Living  0     SAB  1   TAB      Ectopic  1   Multiple      Live Births               Home Medications    Prior to Admission medications   Medication Sig Start Date End Date Taking? Authorizing Provider  fluticasone (FLONASE) 50 MCG/ACT nasal spray Place 2 sprays into both nostrils daily. 07/03/18   Cathie Hoops, Amy V, PA-C  ipratropium (ATROVENT) 0.06 % nasal spray Place 2 sprays into both nostrils 4 (four) times daily. 07/03/18   Cathie Hoops, Amy V, PA-C  nitrofurantoin, macrocrystal-monohydrate, (MACROBID) 100 MG capsule Take 1 capsule (100 mg total) by mouth 2 (two) times daily. 07/03/18   Belinda Fisher, PA-C    Family History Family History  Problem Relation Age of Onset  . Diabetes Mother   . Hypertension Mother   . Hypertension Father   . Heart disease Father   . Anesthesia problems Neg Hx   . Hypotension Neg Hx   . Pseudochol deficiency Neg Hx   . Malignant hyperthermia Neg Hx     Social History Social History   Tobacco Use  . Smoking status: Former Smoker    Last attempt to  quit: 05/05/2005    Years since quitting: 13.1  . Smokeless tobacco: Never Used  Substance Use Topics  . Alcohol use: Yes    Comment: occasional.  . Drug use: No     Allergies   Amoxicillin; Penicillins; and Metronidazole   Review of Systems Review of Systems  Reason unable to perform ROS: See HPI as above.     Physical Exam Triage Vital Signs ED Triage Vitals  Enc Vitals Group     BP 07/03/18 1328 107/79     Pulse Rate 07/03/18 1328 94     Resp 07/03/18 1328 16     Temp 07/03/18 1328 99 F (37.2 C)     Temp Source 07/03/18 1328 Oral     SpO2 07/03/18 1328 98 %     Weight --      Height --      Head Circumference --      Peak Flow --      Pain Score 07/03/18 1330 0     Pain Loc --      Pain Edu? --      Excl. in GC? --    No data found.  Updated Vital Signs BP 107/79 (BP Location: Right Arm)   Pulse 94   Temp 99 F (37.2 C) (Oral)   Resp 16   LMP 06/17/2018    SpO2 98%   Physical Exam Constitutional:      General: She is not in acute distress.    Appearance: She is well-developed. She is not ill-appearing, toxic-appearing or diaphoretic.  HENT:     Head: Normocephalic and atraumatic.     Right Ear: Ear canal and external ear normal. A middle ear effusion is present. Tympanic membrane is not erythematous or bulging.     Left Ear: Tympanic membrane, ear canal and external ear normal. Tympanic membrane is not erythematous or bulging.     Nose: Nose normal.     Right Sinus: No maxillary sinus tenderness or frontal sinus tenderness.     Left Sinus: No maxillary sinus tenderness or frontal sinus tenderness.     Mouth/Throat:     Mouth: Mucous membranes are moist.     Pharynx: Oropharynx is clear. Uvula midline.  Eyes:     Conjunctiva/sclera: Conjunctivae normal.     Pupils: Pupils are equal, round, and reactive to light.  Neck:     Musculoskeletal: Normal range of motion and neck supple.  Cardiovascular:     Rate and Rhythm: Normal rate and regular rhythm.     Heart sounds: Normal heart sounds. No murmur. No friction rub. No gallop.   Pulmonary:     Effort: Pulmonary effort is normal. No accessory muscle usage, prolonged expiration, respiratory distress or retractions.     Breath sounds: Normal breath sounds. No stridor, decreased air movement or transmitted upper airway sounds. No decreased breath sounds, wheezing, rhonchi or rales.  Abdominal:     Tenderness: There is no right CVA tenderness or left CVA tenderness.  Skin:    General: Skin is warm and dry.  Neurological:     Mental Status: She is alert and oriented to person, place, and time.      UC Treatments / Results  Labs (all labs ordered are listed, but only abnormal results are displayed) Labs Reviewed  POCT URINALYSIS DIP (DEVICE) - Abnormal; Notable for the following components:      Result Value   Leukocytes,Ua SMALL (*)    All other components within normal  limits    URINE CULTURE    EKG None  Radiology No results found.  Procedures Procedures (including critical care time)  Medications Ordered in UC Medications - No data to display  Initial Impression / Assessment and Plan / UC Course  I have reviewed the triage vital signs and the nursing notes.  Pertinent labs & imaging results that were available during my care of the patient were reviewed by me and considered in my medical decision making (see chart for details).    Discussed with patient history and exam most consistent with viral URI. Symptomatic treatment as needed. Push fluids. Return precautions given.   Dipstick with small leuks. Will send for urine culture. Will provide macrobid for possible UTI. Push fluids. Return precautions given.  Final Clinical Impressions(s) / UC Diagnoses   Final diagnoses:  Viral URI with cough    ED Prescriptions    Medication Sig Dispense Auth. Provider   fluticasone (FLONASE) 50 MCG/ACT nasal spray Place 2 sprays into both nostrils daily. 1 g Yu, Amy V, PA-C   nitrofurantoin, macrocrystal-monohydrate, (MACROBID) 100 MG capsule Take 1 capsule (100 mg total) by mouth 2 (two) times daily. 10 capsule Yu, Amy V, PA-C   ipratropium (ATROVENT) 0.06 % nasal spray Place 2 sprays into both nostrils 4 (four) times daily. 15 mL Threasa Alpha, [place] 07/03/18 1458

## 2018-07-03 NOTE — Discharge Instructions (Signed)
Start flonase, atrovent nasal spray for nasal congestion/drainage. You can use over the counter nasal saline rinse such as neti pot for nasal congestion. Keep hydrated, your urine should be clear to pale yellow in color. Tylenol/motrin for fever and pain. Monitor for any worsening of symptoms, chest pain, shortness of breath, wheezing, swelling of the throat, follow up for reevaluation.   For sore throat/cough try using a honey-based tea. Use 3 teaspoons of honey with juice squeezed from half lemon. Place shaved pieces of ginger into 1/2-1 cup of water and warm over stove top. Then mix the ingredients and repeat every 4 hours as needed.  Your urine showed mild bacteria. This could be from skin cells or an urinary tract infection. I have sent your urine for culture for definitive diagnosis. You can start macrobid to cover for possible UTI, or you can wait for the urine culture results. Keep hydrated, urine should be clear to pale yellow in color. Monitor for any worsening of symptoms, fever, worsening abdominal pain, nausea/vomiting, flank pain, follow up for reevaluation.

## 2018-07-03 NOTE — ED Triage Notes (Signed)
Pt presents today with sore throat, productive cough, fever, chills, loss of appetite, going on since Thursday. Also thinks she has a UTI. Has lower back pain and urinary frequency since last week.

## 2018-07-04 LAB — URINE CULTURE

## 2019-07-13 ENCOUNTER — Encounter (HOSPITAL_COMMUNITY): Payer: Self-pay | Admitting: Emergency Medicine

## 2019-07-13 ENCOUNTER — Emergency Department (HOSPITAL_COMMUNITY)
Admission: EM | Admit: 2019-07-13 | Discharge: 2019-07-13 | Disposition: A | Discharge status: Home / Self Care | Payer: BC Managed Care – PPO | Attending: Emergency Medicine | Admitting: Emergency Medicine

## 2019-07-13 ENCOUNTER — Other Ambulatory Visit: Payer: Self-pay

## 2019-07-13 DIAGNOSIS — R1032 Left lower quadrant pain: Secondary | ICD-10-CM | POA: Diagnosis not present

## 2019-07-13 DIAGNOSIS — H5789 Other specified disorders of eye and adnexa: Secondary | ICD-10-CM | POA: Diagnosis present

## 2019-07-13 DIAGNOSIS — N898 Other specified noninflammatory disorders of vagina: Secondary | ICD-10-CM | POA: Insufficient documentation

## 2019-07-13 DIAGNOSIS — R300 Dysuria: Secondary | ICD-10-CM | POA: Insufficient documentation

## 2019-07-13 DIAGNOSIS — H01021 Squamous blepharitis right upper eyelid: Secondary | ICD-10-CM | POA: Diagnosis not present

## 2019-07-13 DIAGNOSIS — K582 Mixed irritable bowel syndrome: Secondary | ICD-10-CM

## 2019-07-13 LAB — WET PREP, GENITAL
Sperm: NONE SEEN
Trich, Wet Prep: NONE SEEN
Yeast Wet Prep HPF POC: NONE SEEN

## 2019-07-13 LAB — URINALYSIS, ROUTINE W REFLEX MICROSCOPIC
Bilirubin Urine: NEGATIVE
Glucose, UA: NEGATIVE mg/dL
Hgb urine dipstick: NEGATIVE
Ketones, ur: NEGATIVE mg/dL
Leukocytes,Ua: NEGATIVE
Nitrite: NEGATIVE
Protein, ur: NEGATIVE mg/dL
Specific Gravity, Urine: 1.003 — ABNORMAL LOW (ref 1.005–1.030)
pH: 7 (ref 5.0–8.0)

## 2019-07-13 LAB — POC URINE PREG, ED: Preg Test, Ur: NEGATIVE

## 2019-07-13 NOTE — ED Provider Notes (Signed)
[facility]-EMERGENCY DEPT Provider Note   CSN: [id number] Arrival date & time: 07/13/19  1033     History Chief Complaint  Patient presents with  . Back Pain  . Eye Drainage    Jackie Faulkner is a 39 y.o. female with PMH significant for, IBS-M, BV, and recent Bactrim use after being diagnosed with UTI over telemedicine visit presents to the ED with complaints of dark urine and burning on external genitalia.  She is also endorsing bilateral low back discomfort and LLQ abdominal discomfort relieved with defecation.  She reports that she began her Bactrim approximately 2 weeks ago and shortly thereafter developed increased discharge.  She had Diflucan at home which she has taken and her symptoms have seemed to improve.  She is drinking plenty of fluid, but is concerned for infection because her urine has maintained dark character.  Her last menses was approximately 7 days ago and she used pads that she believes may have caused her external vaginal irritation.  She has a follow-up appointment with her OB/GYN in two days.  She also is endorsing a 66-month history of right eyelid crusting for which she had seen an ophthalmologist and they suggested allergies.  She denies any fevers or chills, headache or dizziness, abdominal pain, nausea vomiting, STI concern, significant vaginal discharge, vaginal bleeding, dysuria, hematochezia, or other symptoms.  HPI     Past Medical History:  Diagnosis Date  . Acid reflux   . Anxiety   . Chest pain, unspecified   . Ectopic pregnancy June 2015   methotrexate  . Eczema   . Goiter, unspecified   . IBS (irritable bowel syndrome)   . Tachycardia     Patient Active Problem List   Diagnosis Date Noted  . History of recurrent miscarriages, not currently pregnant 06/07/2014  . Tachycardia 04/27/2013  . GOITER, UNSPECIFIED 06/07/2009    Past Surgical History:  Procedure Laterality Date  . BILATERAL SALPINGECTOMY Bilateral  10/12/2013   Procedure: LEFT  SALPINGECTOMY;  Surgeon: Leslie Andrea, MD;  Location: [facility];  Service: Gynecology;  Laterality: Bilateral;  . DILATION AND CURETTAGE OF UTERUS N/A 10/12/2013   Procedure: SUCTION DILATATION AND CURETTAGE;  Surgeon: Leslie Andrea, MD;  Location: [facility];  Service: Gynecology;  Laterality: N/A;  . EXTERNAL EAR SURGERY    . LAPAROSCOPY N/A 10/12/2013   Procedure: LAPAROSCOPY DIAGNOSTIC;  Surgeon: Leslie Andrea, MD;  Location: [facility];  Service: Gynecology;  Laterality: N/A;  patient is in MAU  . TONSILLECTOMY       OB History    Gravida  3   Para      Term      Preterm      AB  2   Living  0     SAB  1   TAB      Ectopic  1   Multiple      Live Births              Family History  Problem Relation Age of Onset  . Diabetes Mother   . Hypertension Mother   . Hypertension Father   . Heart disease Father   . Anesthesia problems Neg Hx   . Hypotension Neg Hx   . Pseudochol deficiency Neg Hx   . Malignant hyperthermia Neg Hx     Social History   Tobacco Use  . Smoking status: Former Smoker    Quit date: 05/05/2005    Years  since quitting: 14.1  . Smokeless tobacco: Never Used  Substance Use Topics  . Alcohol use: Yes    Comment: occasional.  . Drug use: No    Home Medications Prior to Admission medications   Medication Sig Start Date End Date Taking? Authorizing Provider  acetaminophen (TYLENOL) 325 MG tablet Take 650 mg by mouth every 6 (six) hours as needed for mild pain or headache.   Yes [provider]  busPIRone (BUSPAR) 5 MG tablet Take 5 mg by mouth 3 (three) times daily. 07/05/19  Yes [provider]  hydrocortisone 2.5 % ointment Apply 1 application topically 3 (three) times daily as needed. Face irritation 04/15/19  Yes [provider]  ibuprofen (ADVIL) 200 MG tablet Take 400 mg by mouth every 6 (six) hours as needed for fever, headache or moderate pain.   Yes [provider]  polyvinyl alcohol (LIQUIFILM TEARS) 1.4 % ophthalmic solution Place 1 drop into both eyes as needed for dry eyes.   Yes [provider]  fluticasone (FLONASE) 50 MCG/ACT nasal spray Place 2 sprays into both nostrils daily. Patient not taking: Reported on 07/13/2019 07/03/18   Belinda Fisher, PA-C  ipratropium (ATROVENT) 0.06 % nasal spray Place 2 sprays into both nostrils 4 (four) times daily. Patient not taking: Reported on 07/13/2019 07/03/18   Belinda Fisher, PA-C  nitrofurantoin, macrocrystal-monohydrate, (MACROBID) 100 MG capsule Take 1 capsule (100 mg total) by mouth 2 (two) times daily. Patient not taking: Reported on 07/13/2019 07/03/18   Belinda Fisher, PA-C    Allergies    Amoxicillin, Penicillins, Doxycycline, and Metronidazole  Review of Systems   Review of Systems  All other systems reviewed and are negative.    Physical Exam Updated Vital Signs BP (!) 138/99   Pulse 80   Temp 98.4 F (36.9 C) (Oral)   Resp 18   SpO2 99%   Physical Exam Vitals and nursing note reviewed. Exam conducted with a chaperone present.  Constitutional:      General: She is not in acute distress.    Appearance: Normal appearance. She is not ill-appearing.  HENT:     Head: Normocephalic and atraumatic.  Eyes:     Comments: Right eye: Scaling on right upper eyelid, mildly erythematous.  No significant swelling.  Conjunctive are noninjected. Left eye: Normal.  Cardiovascular:     Rate and Rhythm: Normal rate and regular rhythm.     Pulses: Normal pulses.     Heart sounds: Normal heart sounds.  Pulmonary:     Effort: Pulmonary effort is normal.  Abdominal:     Comments: Soft, nondistended.  No significant TTP.  No overlying skin changes.  No guarding.  Normoactive bowel sounds.  Genitourinary:    Comments: External genitalia: No significant erythema, rash, or wound appreciated.  Mild, thin white discharge noted in vaginal vault.   Musculoskeletal:     Cervical back: Normal range of motion and  neck supple. No rigidity.  Skin:    General: Skin is dry.     Capillary Refill: Capillary refill takes less than 2 seconds.  Neurological:     Mental Status: She is alert and oriented to person, place, and time.     GCS: GCS eye subscore is 4. GCS verbal subscore is 5. GCS motor subscore is 6.  Psychiatric:        Mood and Affect: Mood normal.        Behavior: Behavior normal.  Thought Content: Thought content normal.     ED Results / Procedures / Treatments   Labs (all labs ordered are listed, but only abnormal results are displayed) Labs Reviewed  WET PREP, GENITAL - Abnormal; Notable for the following components:      Result Value   Clue Cells Wet Prep HPF POC PRESENT (*)    WBC, Wet Prep HPF POC FEW (*)    All other components within normal limits  URINALYSIS, ROUTINE W REFLEX MICROSCOPIC - Abnormal; Notable for the following components:   Color, Urine STRAW (*)    Specific Gravity, Urine 1.003 (*)    All other components within normal limits  POC URINE PREG, ED    EKG None  Radiology No results found.  Procedures Procedures (including critical care time)  Medications Ordered in ED Medications - No data to display  ED Course  I have reviewed the triage vital signs and the nursing notes.  Pertinent labs & imaging results that were available during my care of the patient were reviewed by me and considered in my medical decision making (see chart for details).    MDM Rules/Calculators/A&P                      Patient had presented the ED here today with numerous complaints.  Her external vaginal discomfort/itching she associates with her pads used during most recent menses.  She also endorses thicker discharge at that time for which she took Diflucan.  Since then, she endorses significant improvement in her symptoms.  While looking at external genitalia, noticed thin white discharge in vaginal vault.  Performed wet prep which demonstrated clue cells and WBCs,  but otherwise unremarkable.  She has a history of hives with Flagyl.  At this time, she is denying any significant vaginal discomfort or odor and would prefer to avoid additional antibiotics.  She recently had been treated with Bactrim for a questionable UTI.  While she was concerned regarding a dark color to her urine, she has no hematuria and her UA demonstrates no evidence of infection or dehydration.  I am also encouraging her to avoid using her particularly strong antibiotic soaps in her genital region as this could be contributing to her recurrent episodes of BV.  Patient is also endorsing LLQ abdominal discomfort that is intermittent and relieved with defecation.  This is consistent with her history of IBS-M.  No significant tenderness on my exam.  No black skin changes or rigidity.  Do not feel as though imaging or further work-up is warranted.  She also reports a 35-month history of right upper eyelid scaling and intermittent tearing, but no significant discomfort or foreign body sensation.  She had been evaluated by an ophthalmologist who suggested possible seasonal allergies.  Given unilateral nature and scaling on exam, suspect possible blepharitis.  Encouraging warm compresses as she has not done that previously.  If it continues to persist despite warm compresses, she may benefit from erythromycin ointment.    Patient is in no acute distress and her vital signs are reassuring.  She was very pleased with today's work-up and will follow up with her OB/GYN as scheduled in 2 days for ongoing evaluation and management.  ED return precautions discussed.  All of the evaluation and work-up results were discussed with the patient and any family at bedside. They were provided opportunity to ask any additional questions and have none at this time. They have expressed understanding of verbal discharge instructions as  well as return precautions and are agreeable to the plan.    Final Clinical Impression(s) /  ED Diagnoses Final diagnoses:  Squamous blepharitis of right upper eyelid  Irritable bowel syndrome with both constipation and diarrhea    Rx / DC Orders ED Discharge Orders    None       Corena Herter, PA-C 07/13/19 1339    Daleen Bo, MD 07/14/19 [phone]

## 2019-07-13 NOTE — Discharge Instructions (Addendum)
Warm compresses to affected eyelid 4-5 times per day until symptoms improve.  Continue with antihistamines, as directed.  Avoid strong antibacterial soap in genital region given your frequency of BV.  Also please avoid those specific pads as they may have been contributing factor for your external vaginal discomfort/itching.   Please return to the ED or seek immediate medical attention should you develop any new or worsening symptoms.

## 2019-07-13 NOTE — ED Triage Notes (Signed)
Pt reports that couple weeks ago had lower back pains and dark urine. Reports that pains started back up with dark urine again 3 days. Ago. Has burning on outside of genitale area. Pt reports that "my aren't white and this one waters and wake up with goop in it". Unsure if this all is related to her gallbladder she was supposed to get taken out.

## 2020-06-15 ENCOUNTER — Telehealth: Payer: Self-pay | Admitting: Family

## 2020-06-15 NOTE — Telephone Encounter (Signed)
Called patient to schedule appointment from the online request, patient answered then hung up after greeting

## 2021-05-29 ENCOUNTER — Ambulatory Visit: Payer: BC Managed Care – PPO | Admitting: Dermatology

## 2021-08-19 ENCOUNTER — Ambulatory Visit: Payer: BC Managed Care – PPO | Admitting: Dermatology

## 2022-04-10 ENCOUNTER — Emergency Department (HOSPITAL_COMMUNITY)
Admission: EM | Admit: 2022-04-10 | Discharge: 2022-04-10 | Disposition: A | Discharge status: Home / Self Care | Payer: Commercial Managed Care - PPO | Attending: Emergency Medicine | Admitting: Emergency Medicine

## 2022-04-10 ENCOUNTER — Other Ambulatory Visit: Payer: Self-pay

## 2022-04-10 ENCOUNTER — Emergency Department (HOSPITAL_COMMUNITY): Payer: Commercial Managed Care - PPO

## 2022-04-10 DIAGNOSIS — R059 Cough, unspecified: Secondary | ICD-10-CM | POA: Diagnosis present

## 2022-04-10 DIAGNOSIS — J069 Acute upper respiratory infection, unspecified: Secondary | ICD-10-CM | POA: Diagnosis not present

## 2022-04-10 DIAGNOSIS — Z87891 Personal history of nicotine dependence: Secondary | ICD-10-CM | POA: Diagnosis not present

## 2022-04-10 DIAGNOSIS — Z20822 Contact with and (suspected) exposure to covid-19: Secondary | ICD-10-CM | POA: Insufficient documentation

## 2022-04-10 LAB — RESP PANEL BY RT-PCR (FLU A&B, COVID) ARPGX2
Influenza A by PCR: NEGATIVE
Influenza B by PCR: NEGATIVE
SARS Coronavirus 2 by RT PCR: NEGATIVE

## 2022-04-10 MED ORDER — ACETAMINOPHEN 500 MG PO TABS
1000.0000 mg | ORAL_TABLET | Freq: Once | ORAL | Status: AC
  Administered 2022-04-10: 1000 mg via ORAL
  Filled 2022-04-10: qty 2

## 2022-04-10 NOTE — ED Triage Notes (Signed)
Patient reports fever with chills , productive cough , headache and sore throat onset yesterday .

## 2022-04-10 NOTE — Discharge Instructions (Signed)
We evaluated you for your upper respiratory tract infection. Please take Tylenol and Motrin for your symptoms at home.  You can take 1000 mg of Tylenol every 6 hours and 600 mg of ibuprofen every 6 hours as needed for your symptoms.  You can take these medicines together as needed, either at the same time, or alternating every 3 hours.  If you take a medicine such as NyQuil or DayQuil, these also contain Tylenol, so make sure you are taking in total less than or equal to 1000 mg of tylenol every 6 hours.

## 2022-04-10 NOTE — ED Provider Triage Note (Signed)
  Emergency Medicine Provider Triage Evaluation Note  MRN:  [id number]  Arrival date & time: 04/10/22    Medically screening exam initiated at 6:29 AM.   CC:   Cough / Fever   HPI:  Aissatou A Freehling is a 41 y.o. year-old female presents to the ED with chief complaint of flulike symptoms.  States that symptoms started 2 days ago.  Reports associate sore throat and body aches.  Measured fever to 102.  History provided by patient. ROS:  -As included in HPI PE:   Vitals:   04/10/22 0625  BP: 124/83  Pulse: (!) 119  Resp: 20  Temp: (!) 101.8 F (38.8 C)  SpO2: 98%    Non-toxic appearing No respiratory distress  MDM:  Based on signs and symptoms, flu is highest on my differential, followed by covid. I've ordered imaging and flu test in triage to expedite lab/diagnostic workup.3  Offered to treat empirically, but patient asks for testing.  Patient was informed that the remainder of the evaluation will be completed by another provider, this initial triage assessment does not replace that evaluation, and the importance of remaining in the ED until their evaluation is complete.    Roxy Horseman, PA-C 04/10/22 [phone]

## 2022-04-10 NOTE — ED Provider Notes (Signed)
[facility]  CSN: [id number] Arrival date & time: 04/10/22 [id number]  Chief Complaint(s) Cough / Fever  HPI Jackie Faulkner is a 41 y.o. female without significant past medical history presenting to the emergency department with cough.  She reports 2 days of cough with associated sore throat, body aches, fevers and chills, runny nose and congestion.  No chest pain, abdominal pain, nausea, vomiting, diarrhea.  Fever improves slightly with Tylenol and Motrin.  No shortness of breath.   Past Medical History Past Medical History:  Diagnosis Date   Acid reflux    Anxiety    Chest pain, unspecified    Ectopic pregnancy June 2015   methotrexate   Eczema    Goiter, unspecified    IBS (irritable bowel syndrome)    Tachycardia    Patient Active Problem List   Diagnosis Date Noted   History of recurrent miscarriages, not currently pregnant 06/07/2014   Tachycardia 04/27/2013   GOITER, UNSPECIFIED 06/07/2009   Home Medication(s) Prior to Admission medications   Medication Sig Start Date End Date Taking? Authorizing Provider  acetaminophen (TYLENOL) 325 MG tablet Take 650 mg by mouth every 6 (six) hours as needed for mild pain or headache.    [provider]  busPIRone (BUSPAR) 5 MG tablet Take 5 mg by mouth 3 (three) times daily. 07/05/19   [provider]  fluticasone (FLONASE) 50 MCG/ACT nasal spray Place 2 sprays into both nostrils daily. Patient not taking: Reported on 07/13/2019 07/03/18   Belinda FisherYu, Amy V, PA-C  hydrocortisone 2.5 % ointment Apply 1 application topically 3 (three) times daily as needed. Face irritation 04/15/19   [provider]  ibuprofen (ADVIL) 200 MG tablet Take 400 mg by mouth every 6 (six) hours as needed for fever, headache or moderate pain.    [provider]  ipratropium (ATROVENT) 0.06 % nasal spray Place 2 sprays into both nostrils 4 (four) times daily. Patient not taking: Reported  on 07/13/2019 07/03/18   Belinda FisherYu, Amy V, PA-C  nitrofurantoin, macrocrystal-monohydrate, (MACROBID) 100 MG capsule Take 1 capsule (100 mg total) by mouth 2 (two) times daily. Patient not taking: Reported on 07/13/2019 07/03/18   Belinda FisherYu, Amy V, PA-C  polyvinyl alcohol (LIQUIFILM TEARS) 1.4 % ophthalmic solution Place 1 drop into both eyes as needed for dry eyes.    [provider]                                                                                                                                    Past Surgical History Past Surgical History:  Procedure Laterality Date   BILATERAL SALPINGECTOMY Bilateral 10/12/2013   Procedure: LEFT  SALPINGECTOMY;  Surgeon: Leslie AndreaJames E Tomblin II, MD;  Location: [facility];  Service: Gynecology;  Laterality: Bilateral;   DILATION AND CURETTAGE OF UTERUS N/A 10/12/2013   Procedure: SUCTION DILATATION AND CURETTAGE;  Surgeon: Roselle LocusJames E Tomblin II,  MD;  Location: [facility];  Service: Gynecology;  Laterality: N/A;   EXTERNAL EAR SURGERY     LAPAROSCOPY N/A 10/12/2013   Procedure: LAPAROSCOPY DIAGNOSTIC;  Surgeon: Leslie Andrea, MD;  Location: [facility];  Service: Gynecology;  Laterality: N/A;  patient is in MAU   TONSILLECTOMY     Family History Family History  Problem Relation Age of Onset   Diabetes Mother    Hypertension Mother    Hypertension Father    Heart disease Father    Anesthesia problems Neg Hx    Hypotension Neg Hx    Pseudochol deficiency Neg Hx    Malignant hyperthermia Neg Hx     Social History Social History   Tobacco Use   Smoking status: Former    Types: Cigarettes    Quit date: 05/05/2005    Years since quitting: 16.9   Smokeless tobacco: Never  Vaping Use   Vaping Use: Never used  Substance Use Topics   Alcohol use: Yes    Comment: occasional.   Drug use: No   Allergies Amoxicillin, Penicillins, Doxycycline, and Metronidazole  Review of Systems Review of Systems  All other systems reviewed and are negative.   Physical  Exam Vital Signs  I have reviewed the triage vital signs BP 99/75   Pulse 81   Temp 98.6 F (37 C) (Oral)   Resp 15   LMP 04/07/2022 (Within Days)   SpO2 95%  Physical Exam Vitals and nursing note reviewed.  Constitutional:      General: She is not in acute distress.    Appearance: She is well-developed.  HENT:     Head: Normocephalic and atraumatic.     Mouth/Throat:     Mouth: Mucous membranes are moist.     Pharynx: No oropharyngeal exudate or posterior oropharyngeal erythema.  Eyes:     Pupils: Pupils are equal, round, and reactive to light.  Cardiovascular:     Rate and Rhythm: Normal rate and regular rhythm.     Heart sounds: No murmur heard. Pulmonary:     Effort: Pulmonary effort is normal. No respiratory distress.     Breath sounds: Normal breath sounds.  Abdominal:     General: Abdomen is flat.     Palpations: Abdomen is soft.     Tenderness: There is no abdominal tenderness.  Musculoskeletal:        General: No tenderness.     Right lower leg: No edema.     Left lower leg: No edema.  Skin:    General: Skin is warm and dry.  Neurological:     General: No focal deficit present.     Mental Status: She is alert. Mental status is at baseline.  Psychiatric:        Mood and Affect: Mood normal.        Behavior: Behavior normal.     ED Results and Treatments Labs (all labs ordered are listed, but only abnormal results are displayed) Labs Reviewed  RESP PANEL BY RT-PCR (FLU A&B, COVID) ARPGX2  Radiology DG Chest 2 View  Result Date: 04/10/2022 CLINICAL DATA:  41 year old female with shortness of breath, fever chills, cough, headache, sore throat. EXAM: CHEST - 2 VIEW COMPARISON:  Chest and abdominal series 07/08/2015 and earlier. FINDINGS: Lung volumes and mediastinal contours remain normal. Visualized tracheal air column is within normal  limits. Lung markings are stable and within normal limits. Both lungs appear clear. No pneumothorax or pleural effusion. Negative visible bowel gas and osseous structures. IMPRESSION: Negative.  No cardiopulmonary abnormality. Electronically Signed   By: Odessa Fleming M.D.   On: 04/10/2022 07:38    Pertinent labs & imaging results that were available during my care of the patient were reviewed by me and considered in my medical decision making (see MDM for details).  Medications Ordered in ED Medications  acetaminophen (TYLENOL) tablet 1,000 mg (1,000 mg Oral Given 04/10/22 9379)                                                                                                                                     Procedures Procedures  (including critical care time)  Medical Decision Making / ED Course   MDM:  41 year old female presenting to the emergency department with upper respiratory tract symptoms.  Patient well-appearing, vital signs after Tylenol reassuring with no fever or tachycardia.  Initial vital signs notable for fever.  Chest x-ray obtained in triage clear.  Suspect upper respiratory tract infection.  Low/COVID swab negative.  Lungs clear on exam and chest x-ray clear without evidence of pneumonia.  Oropharyngeal exam reassuring without evidence of bacterial causes of sore throat.  Discussed supportive care. Will discharge patient to home. All questions answered. Patient comfortable with plan of discharge. Return precautions discussed with patient and specified on the after visit summary.       Additional history obtained: -Additional history obtained from family   Lab Tests: -I ordered, reviewed, and interpreted labs.   The pertinent results include:   Labs Reviewed  RESP PANEL BY RT-PCR (FLU A&B, COVID) ARPGX2    Notable for negative flu/covid  Imaging Studies ordered: I ordered imaging studies including CXR On my interpretation imaging demonstrates no infiltrate I  independently visualized and interpreted imaging. I agree with the radiologist interpretation   Medicines ordered and prescription drug management: Meds ordered this encounter  Medications   acetaminophen (TYLENOL) tablet 1,000 mg    -I have reviewed the patients home medicines and have made adjustments as needed   Reevaluation: After the interventions noted above, I reevaluated the patient and found that they have improved  Co morbidities that complicate the patient evaluation  Past Medical History:  Diagnosis Date   Acid reflux    Anxiety    Chest pain, unspecified    Ectopic pregnancy June 2015   methotrexate   Eczema    Goiter, unspecified    IBS (irritable bowel syndrome)    Tachycardia  Dispostion: Disposition decision including need for hospitalization was considered, and patient discharged from emergency department.    Final Clinical Impression(s) / ED Diagnoses Final diagnoses:  Viral URI with cough     This chart was dictated using voice recognition software.  Despite best efforts to proofread,  errors can occur which can change the documentation meaning.    Lonell Grandchild, MD 04/10/22 1011

## 2022-09-22 ENCOUNTER — Encounter (HOSPITAL_COMMUNITY): Payer: Self-pay

## 2022-09-22 ENCOUNTER — Other Ambulatory Visit: Payer: Self-pay

## 2022-09-22 ENCOUNTER — Emergency Department (HOSPITAL_COMMUNITY)
Admission: EM | Admit: 2022-09-22 | Discharge: 2022-09-23 | Disposition: A | Discharge status: Home / Self Care | Payer: Worker's Compensation | Attending: Emergency Medicine | Admitting: Emergency Medicine

## 2022-09-22 DIAGNOSIS — F32A Depression, unspecified: Secondary | ICD-10-CM | POA: Insufficient documentation

## 2022-09-22 DIAGNOSIS — F419 Anxiety disorder, unspecified: Secondary | ICD-10-CM | POA: Diagnosis not present

## 2022-09-22 DIAGNOSIS — F489 Nonpsychotic mental disorder, unspecified: Secondary | ICD-10-CM | POA: Insufficient documentation

## 2022-09-22 LAB — I-STAT BETA HCG BLOOD, ED (MC, WL, AP ONLY): I-stat hCG, quantitative: <5 m[IU]/mL (ref <5)
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
## 2022-09-23 NOTE — BH Assessment (Addendum)
This consult has been deferred to Jackie Faulkner. Jackie Faulkner coordinator was unable to provide the provider's name and consult time. Pt's treatment team will be contacted by Jackie Faulkner with updated information once consult is scheduled.

## 2022-09-23 NOTE — BH Assessment (Signed)
TTS contacted [facility] to cancel consult due to pt being discharged by Dr. Roxy Horseman, PA-C.

## 2022-09-23 NOTE — ED Notes (Signed)
Pt dressed down into burgundy scrubs with pt clothes placed in pt belongings bag located at nursing station [place] B 9-12. 1 red shirt 1 pair of blue jeans.

## 2022-09-23 NOTE — ED Notes (Signed)
Pt wanded by security. 

## 2023-12-03 ENCOUNTER — Encounter (HOSPITAL_COMMUNITY): Payer: Self-pay

## 2023-12-03 ENCOUNTER — Ambulatory Visit (HOSPITAL_COMMUNITY)
Admission: EM | Admit: 2023-12-03 | Discharge: 2023-12-03 | Disposition: A | Discharge status: Home / Self Care | Payer: Self-pay | Attending: Emergency Medicine | Admitting: Emergency Medicine

## 2023-12-03 DIAGNOSIS — T2220XA Burn of second degree of shoulder and upper limb, except wrist and hand, unspecified site, initial encounter: Secondary | ICD-10-CM

## 2023-12-03 MED ORDER — SILVER SULFADIAZINE 1 % EX CREA: 1.0000 | TOPICAL_CREAM | Freq: Every day | CUTANEOUS | 0 refills | Status: AC

## 2023-12-03 MED ORDER — SILVER SULFADIAZINE 1 % EX CREA
TOPICAL_CREAM | CUTANEOUS | Status: AC
  Filled 2023-12-03: qty 85

## 2023-12-03 MED ORDER — HYDROCORTISONE 2.5 % EX OINT: 1.0000 | TOPICAL_OINTMENT | Freq: Three times a day (TID) | CUTANEOUS | 0 refills | Status: AC | PRN

## 2023-12-03 MED ORDER — SILVER SULFADIAZINE 1 % EX CREA
TOPICAL_CREAM | Freq: Once | CUTANEOUS | Status: AC
  Administered 2023-12-03: 1 via TOPICAL
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
## 2023-12-03 NOTE — Discharge Instructions (Signed)
 Continue to keep your wound clean and dry and covered throughout the day.  Clean with lukewarm water  and antibacterial solution like Hibiclens, pat dry and apply Silvadene .  You can keep the burn open at home, keep it covered at night and when out and about.  Continue to perform wound care until the area heals.  The area may take a few weeks to heal fully.  Return to clinic for any signs or symptoms of infection.  I have refilled your hydrocortisone  ointment, follow-up with your primary care and dermatologist.  Return to clinic for any new or urgent symptoms.
